# Patient Record
Sex: Female | Born: 1990
Health system: Southern US, Community
[De-identification: ages and names within clinical notes are randomized; demographics above are authoritative.]

## PROBLEM LIST (undated history)

## (undated) ENCOUNTER — Inpatient Hospital Stay: Payer: Self-pay

## (undated) DIAGNOSIS — O26879 Cervical shortening, unspecified trimester: Secondary | ICD-10-CM

## (undated) DIAGNOSIS — D649 Anemia, unspecified: Secondary | ICD-10-CM

## (undated) DIAGNOSIS — Z789 Other specified health status: Secondary | ICD-10-CM

## (undated) HISTORY — PX: NO PAST SURGERIES: SHX2092

## (undated) HISTORY — DX: Cervical shortening, unspecified trimester: O26.879

---
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
## 2009-01-03 ENCOUNTER — Ambulatory Visit: Payer: Self-pay | Admitting: Family Medicine

## 2009-01-03 DIAGNOSIS — R5381 Other malaise: Secondary | ICD-10-CM

## 2009-01-03 DIAGNOSIS — R5383 Other fatigue: Secondary | ICD-10-CM

## 2009-01-03 LAB — CONVERTED CEMR LAB
ALT: 12 units/L (ref 0–35)
CO2: 23 meq/L (ref 19–32)
Calcium: 9.5 mg/dL (ref 8.4–10.5)
Chloride: 107 meq/L (ref 96–112)
Cholesterol: 130 mg/dL (ref 0–200)
Creatinine, Ser: 0.72 mg/dL (ref 0.40–1.20)
Glucose, Bld: 93 mg/dL (ref 70–99)
HCT: 39 % (ref 36.0–46.0)
MCV: 87.6 fL (ref 78.0–100.0)
Platelets: 239 10*3/uL (ref 150–400)
RBC: 4.45 M/uL (ref 3.87–5.11)
Total CHOL/HDL Ratio: 2.8
Total Protein: 7.7 g/dL (ref 6.0–8.3)
Triglycerides: 45 mg/dL (ref <150)
WBC: 3.5 10*3/uL — ABNORMAL LOW (ref 4.0–10.5)

## 2009-01-08 ENCOUNTER — Encounter: Payer: Self-pay | Admitting: Family Medicine

## 2009-02-11 ENCOUNTER — Emergency Department (HOSPITAL_COMMUNITY): Admission: EM | Admit: 2009-02-11 | Discharge: 2009-02-11 | Payer: Self-pay | Admitting: Family Medicine

## 2009-03-13 ENCOUNTER — Ambulatory Visit: Payer: Self-pay | Admitting: Family Medicine

## 2009-03-13 ENCOUNTER — Telehealth: Payer: Self-pay | Admitting: Family Medicine

## 2009-03-13 DIAGNOSIS — R079 Chest pain, unspecified: Secondary | ICD-10-CM | POA: Insufficient documentation

## 2010-02-19 ENCOUNTER — Ambulatory Visit: Payer: Self-pay | Admitting: Family Medicine

## 2010-03-16 ENCOUNTER — Emergency Department (HOSPITAL_COMMUNITY): Admission: EM | Admit: 2010-03-16 | Discharge: 2010-03-16 | Payer: Self-pay | Admitting: Family Medicine

## 2010-03-20 ENCOUNTER — Telehealth: Payer: Self-pay | Admitting: Family Medicine

## 2010-03-20 ENCOUNTER — Ambulatory Visit (HOSPITAL_COMMUNITY)
Admission: RE | Admit: 2010-03-20 | Discharge: 2010-03-20 | Payer: Self-pay | Source: Home / Self Care | Admitting: Sports Medicine

## 2010-03-20 ENCOUNTER — Ambulatory Visit: Payer: Self-pay | Admitting: Family Medicine

## 2010-03-20 ENCOUNTER — Encounter: Payer: Self-pay | Admitting: Family Medicine

## 2010-03-20 LAB — CONVERTED CEMR LAB
BUN: 6 mg/dL (ref 6–23)
CO2: 22 meq/L (ref 19–32)
Glucose, Bld: 90 mg/dL (ref 70–99)
Potassium: 3.7 meq/L (ref 3.5–5.3)
Sodium: 138 meq/L (ref 135–145)

## 2010-03-27 ENCOUNTER — Ambulatory Visit: Payer: Self-pay | Admitting: Family Medicine

## 2010-03-27 ENCOUNTER — Encounter: Payer: Self-pay | Admitting: Family Medicine

## 2010-03-28 ENCOUNTER — Encounter: Payer: Self-pay | Admitting: Family Medicine

## 2010-03-29 LAB — CONVERTED CEMR LAB
HCT: 34 % — ABNORMAL LOW (ref 36.0–46.0)
MCV: 86.1 fL (ref 78.0–100.0)
Platelets: 453 10*3/uL — ABNORMAL HIGH (ref 150–400)
RDW: 13 % (ref 11.5–15.5)

## 2010-04-03 ENCOUNTER — Encounter: Payer: Self-pay | Admitting: Family Medicine

## 2010-04-03 ENCOUNTER — Ambulatory Visit: Payer: Self-pay | Admitting: Family Medicine

## 2010-04-07 ENCOUNTER — Encounter: Payer: Self-pay | Admitting: Family Medicine

## 2010-04-23 ENCOUNTER — Encounter: Payer: Self-pay | Admitting: Family Medicine

## 2010-05-09 ENCOUNTER — Ambulatory Visit: Payer: Self-pay | Admitting: Internal Medicine

## 2010-05-21 NOTE — Assessment & Plan Note (Signed)
Summary: RIB CAGE PAIN/RH   Vital Signs:  Patient profile:   20 year old female Weight:      109 pounds BMI:     18.78 Temp:     98.5 degrees F oral Pulse rate:   99 / minute Pulse rhythm:   regular BP sitting:   122 / 78  (left arm) Cuff size:   regular  Vitals Entered By: Mercedes Patel CMA (March 20, 2010 8:50 AM) CC: Rib Pain Is Patient Diabetic? No Pain Assessment Patient in pain? yes     Location: ribs Intensity: 9 Type: sharp Onset of pain  With activity Comments pt went to [facility] on saturday and was told that she had costocondritis   Primary Care Provider:  Denny Levy MD  CC:  Rib Pain.  History of Present Illness: [name] presents to clinic today for acute rib pain. The pain is located bilaterally in her lowest true ribs, and is tender to the touch.  Her pain has been off and on for around 1 year. However it actuely worsend this week and again yesterday. She presented to urgent care where she was diagnosed with costrocondritis vs sliding rib. She was adivsed to take full dose ibuprofen and follow up at the family practice center.  She did not take any ibuprofen last night and her pain continues today. She denies any fever, chills dyspnea on exertion, chest pain on exertion, or pain worse with eating. She does note that her pain is worse with bending or stretching.  She does note a pop in her ribs that occurs with pain when she rotates her torso or arms.    That pain previously was controlled with ibuprofen.  Habits & Providers  Alcohol-Tobacco-Diet     Tobacco Status: never  Current Problems (verified): 1)  Preventive Health Care  (ICD-V70.0) 2)  Rib Pain  (ICD-786.50) 3)  Fatigue  (ICD-780.79)  Current Medications (verified): 1)  Meloxicam 7.5 Mg Tabs (Meloxicam) .Marland Kitchen.. 1-2 By Mouth Daily As Needed Rib Pain. Take With Food 2)  Ranitidine Hcl 150 Mg Caps (Ranitidine Hcl) .Marland Kitchen.. 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 no tobacco, alcohol or illicits no regular exercise lives w Mom Mercedes Patel), Dad (Mercedes Patel) and younger sister Mercedes Patel)  Past Medical History: Rib pain think sliding rib x1 year 02/2010  Past Surgical History: NA  Family History: HTN in mother no significant FH ASCVD or cancer  Social History: Reviewed history from 01/03/2009 and no changes required. [employer] student in marketing, planning switch to psychology. no tobacco, alcohol or illicits no regular exercise lives w Mom Mercedes Patel), Dad (Mercedes Patel) and younger sister Mercedes Patel)  Review of Systems  The patient denies anorexia, fever, weight loss, chest pain, syncope, dyspnea on exertion, headaches, abdominal pain, hematochezia, severe indigestion/heartburn, muscle weakness, difficulty walking, and enlarged lymph nodes.    Physical Exam  General:  VS noted.  Thin woman with pain on motion Head:  normocephalic, atraumatic, and no abnormalities observed.   Eyes:  vision grossly intact.   Ears:  no external deformities.   Nose:  no external erythema.   Mouth:  good dentition, MMM Neck:  No JVD Lungs:  Normal respiratory effort, chest expands symmetrically. Lungs are clear to auscultation, no crackles or wheezes. Heart:  normal rate, regular rhythm, and no murmur.   Abdomen:  soft, non-tender, normal bowel sounds, no distention, and no masses.   Msk:  Ribs: TTP in bilateral lowest true ribs laterally.  A faint pop is noted with rotation of torso and ROM of arm in the tender area.  Extremities:  Non edemetus warm and well perfused.  Cervical Nodes:  No lymphadenopathy noted Axillary Nodes:  No palpable lymphadenopathy Psych:  memory intact for recent and remote, normally interactive, good eye contact, and not  anxious appearing.     Impression & Recommendations:  Problem # 1:  RIB PAIN (ICD-786.50) Assessment New  Exerbation of an existing issue. However the current pain is new. My chief diagnosis is sliding rib vs costrocondirits. I feel that bone pain due to malignancy is very unlikely due to no fever/chills or weight loss and age of 20 years old.   However I have discussed the case with her PCP (Dr. Jennette Patel) and Dr. Swaziland as well as [name]'s mother.  At this time they elect for more agressive diagnosis.  Will obtain a CT scan of her chest and abdomen with IV contrast to evaluate for question of maliganancy as well as evaluate cartilage for signs of chronic inflimation.  Will also obtain a BMP now prior to the CT scan to eval creatine prior to the contrast.   Treatment: Will treat with NSAIDs:  Ketoralic 10mg  IM now followed by Meloxican 7.5-15 mg daily as needed. As [name] sometimes does not eat well will provide ranitidine two times a day while taking mobic for ulcer prophylaxis.   Reviwed red flags with the patient who expressed understanding. Will follow up with Dr. Jennette Patel in 1-2 weeks. May benifit from PT in the future.   Orders: [facility]- Est  Level 4 ([address])  Complete Medication List: 1)  Meloxicam 7.5 Mg Tabs (Meloxicam) .Marland Kitchen.. 1-2 by mouth daily as needed rib pain. take with food 2)  Ranitidine Hcl 150 Mg Caps (Ranitidine hcl) .Marland Kitchen.. 1 by mouth two times a day while taking mobic  Other Orders: Basic Met-[facility] ([address]) CT with Contrast (CT w/ contrast)  Patient Instructions: 1)  Thank you for seeing me today. 2)  Please make an appiointment to see Dr. Jennette Patel in 1-2 weeks for you rib pain.  3)  Let us know if you get worse or not better with the mobic. 4)  Take the mobic 1-2 pills daily as needed for rib pain. 5)  Take the ranitidine twice daily while taking the mobic. 6)  Try to eat with your mobic.  7)  If you have chest pain, difficulty breathing, fevers over 102 that does not  get better with tylenol please call us or see a doctor.  8)  Good luck and we will try to get you back to the activities that you enjoy. Prescriptions: RANITIDINE HCL 150 MG CAPS (RANITIDINE HCL) 1 by mouth two times a day while taking mobic  #60 x 1   Entered and Authorized by:   Clementeen Graham MD   Signed by:   Clementeen Graham MD on 03/20/2010   Method used:   Electronically to        [facility]* (retail)       [address]       [address] Shipping/mailing       [place], [place]  [address]       Ph: [phone]       Fax: [phone]  RxID:   [id number] MELOXICAM 7.5 MG TABS (MELOXICAM) 1-2 by mouth daily as needed rib pain. Take with food  #30 x 0   Entered and Authorized by:   Clementeen Graham MD   Signed by:   Clementeen Graham MD on 03/20/2010   Method used:   Electronically to        [facility]* (retail)       [address]       [address] Shipping/mailing       [place], [place]  [address]       Ph: [phone]       Fax: [phone]   RxID:   [id number]    Orders Added: 1)  Basic Met-[facility] [phone] 2)  CT with Contrast [CT w/ contrast] 3)  [facility]- Est  Level 4 [[address]]  Appended Document: Sedrate  91 mm/hr    Lab Visit  Laboratory Results   Blood Tests   Date/Time Received: March 20, 2010 9:42 AM  Date/Time Reported: March 20, 2010 12:06 PM   SED rate: 91 mm/hr  Comments: ...............test performed by......Marland KitchenBonnie A. Mercedes Patel, MLS (ASCP)cm    Orders Today: Ketorolac-Toradol 15mg  [R4854]    Medication Administration  Injection # 1:    Medication: Ketorolac-Toradol 15mg     Diagnosis: RIB PAIN (ICD-786.50)    Route: IM    Site: R deltoid    Exp Date: 09/20/2011    Lot #: [id number]    Mfr: hospira    Patient tolerated injection without complications    Given by: Mercedes Patel CMA (March 20, 2010 3:18 PM)  Orders Added: 1)  Ketorolac-Toradol 15mg  [K9381]

## 2010-05-21 NOTE — Miscellaneous (Signed)
  Clinical Lists Changes  Orders: Added new Test order of Sed Rate (ESR)-[facility] [phone]) - Signed

## 2010-05-21 NOTE — Assessment & Plan Note (Signed)
Summary: cpe,df   Vital Signs:  Patient profile:   20 year old female Weight:      106 pounds Temp:     98.3 degrees F oral Pulse rate:   93 / minute Pulse rhythm:   regular BP sitting:   102 / 72  (left arm) Cuff size:   regular  Vitals Entered By: Loralee Pacas CMA (March 27, 2010 8:41 AM) CC: cpe   Primary Care Kalima Saylor:  Denny Levy MD  CC:  cpe.  History of Present Illness: CPE and f/u rib pain with elevated SED rate 1) CPE: normal menstrual cycles, no problems. Active in dance. 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 [place] for Christmas.  2) RIB pain; has almost completely resolved. ecounts thee total episodes with the last one nbeing the most severe. She thinks it might be related to her dance execises but recalls no specifi injury. PERTINENT PMH/PSH: no prior surgries or hospitalizations. No episodes of joint pain redness or swelling. No known FH of rheum disorders.  Current Medications (verified): 1)  None  Allergies (verified): No Known Drug Allergies  Past History:  Past Medical History: recurrent costochondritis ( 3 episodes ) 2011  Past Surgical History: None  Review of Systems  The patient denies anorexia, fever, weight loss, weight gain, chest pain, syncope, dyspnea on exertion, peripheral edema, prolonged cough, headaches, hemoptysis, abdominal pain, severe indigestion/heartburn, muscle weakness, and enlarged lymph nodes.         nno enlarged or painful joints.  Physical Exam  General:  alert, well-developed, well-nourished, and well-hydrated.   Eyes:  PERRLA EOMI conjunctiva non icteric Ears:  Tms normal right ear helix piercing Nose:  no deformity Mouth:  good dentition Neck:  supple, full ROM, no masses, no thyromegaly, no thyroid nodules or tenderness, and no cervical lymphadenopathy.   Lungs:  normal respiratory effort, normal breath sounds, and no wheezes.   Heart:  normal rate, regular rhythm, and no murmur.   Abdomen:  soft,  non-tender, normal bowel sounds, and no masses.   Genitalia:  deferred Msk:  normal ROM, no joint tenderness, no joint swelling, and no joint warmth.   Extremities:  no edema Neurologic:  alert & oriented X3 and strength normal in all extremities.   Skin:  color normal, no rashes, and no suspicious lesions.   Cervical Nodes:  negative Axillary Nodes:  negative Inguinal Nodes:  negative Psych:  Oriented X3, memory intact for recent and remote, normally interactive, good eye contact, not anxious appearing, and not depressed appearing.      Impression & Recommendations:  Problem # 1:  PREVENTIVE HEALTH CARE (ICD-V70.0)  Orders: [facility] - Est  18-39 yrs ([address]) no pap indicated discussed MVI / Calcium exercise and contraception planning  Problem # 2:  RIB PAIN (ICD-786.50)  Orders: [facility] - Est  18-39 yrs ([address]) CBC-[facility] (09811) Sed Rate (ESR)-[facility] (91478) Rheum Fact-[facility] ([address]) ANA-[facility] ([address]-57[address]) this has significantly improved but with three occurrences and n abnormal SED rate we will just do a few scree labs for rheum disorders she will let me know of futire rib pain issues or MSK problems that might be similar   Orders Added: 1)  [facility] - Est  18-39 yrs [[address]395] 2)  CBC-[facility] [85027] 3)  Sed Rate (ESR)-[facility] [85651] 4)  Rheum Fact-[facility] [86430] 5)  ANA-[facility] [96295-28413]    Laboratory Results   Blood Tests   Date/Time Received: March 27, 2010 9:01 AM  Date/Time Reported: March 27, 2010  10:57 AM   SED rate: 94  mm/hr  Comments: ...............test performed by......Marland KitchenBonnie A. Swaziland, MLS (ASCP)cm

## 2010-05-21 NOTE — Assessment & Plan Note (Signed)
Summary: flu shot,df  Nurse Visit   Vital Signs:  Patient profile:   20 year old female Temp:     98.3 degrees F  Vitals Entered By: Theresia Lo RN (February 19, 2010 9:45 AM)  Allergies: No Known Drug Allergies  Immunizations Administered:  Influenza Vaccine # 1:    Vaccine Type: Fluvax 3+    Site: left deltoid    Mfr: GlaxoSmithKline    Dose: 0.5 ml    Route: IM    Given by: Theresia Lo RN    Exp. Date: 10/16/2010    Lot #: [id number]    VIS given: 11/13/09 version given February 19, 2010.  Flu Vaccine Consent Questions:    Do you have a history of severe allergic reactions to this vaccine? no    Any prior history of allergic reactions to egg and/or gelatin? no    Do you have a sensitivity to the preservative Thimersol? no    Do you have a past history of Guillan-Barre Syndrome? no    Do you currently have an acute febrile illness? no    Have you ever had a severe reaction to latex? no    Vaccine information given and explained to patient? yes    Are you currently pregnant? no  Orders Added: 1)  Flu Vaccine 37yrs + [90658] 2)  Admin 1st Vaccine [09811]

## 2010-05-21 NOTE — Assessment & Plan Note (Signed)
Summary: pain under breast /Garrison/neal's pt   Vital Signs:  Patient profile:   20 year old female Height:      64 inches Weight:      108.8 pounds Temp:     98.6 degrees F Pulse rate:   80 / minute BP sitting:   116 / 83  (left arm)  Vitals Entered By: Theresia Lo RN (March 13, 2009 1:51 PM) CC: pain under right breast in rib cage area Is Patient Diabetic? No   Primary Care Provider:  Denny Levy MD  CC:  pain under right breast in rib cage area.  History of Present Illness: 20 y/o thin AAF:  1. Rib pain: right anterior, midclavicular line, ribs 9-10. x a few weeks. worse with rotation, esp when she is working. better with rest. this has happened before: about 6 months ago, same pain, physician at [facility] "popped" it back into place and it was better for several months. she is a Horticulturist, commercial. no known trauma. no obvious deformity. it clicks when she rotates. she denies CP, SOB, N/V/C/D, abdominal pain, back pain.   Habits & Providers  Alcohol-Tobacco-Diet     Tobacco Status: never  Current Medications (verified): 1)  None  Allergies (verified): No Known Drug Allergies  Review of Systems General:  Denies chills, fever, and weight loss. CV:  Denies chest pain or discomfort, palpitations, and shortness of breath with exertion. Resp:  Denies cough, pleuritic, and shortness of breath. MS:  Denies mid back pain. Derm:  Denies rash. Neuro:  Denies numbness and tingling.  Physical Exam  General:  alert, well-developed, and well-nourished.  vitals reviewed. Chest Wall:  mild ttp along the midclavicular line of ribs 9-10 on the right, palpable click when patient rotates to the left and right. no obvious deformity. no skin changes. no splinting.  Lungs:  normal respiratory effort, normal breath sounds, and no wheezes.   Heart:  normal rate, regular rhythm, and no murmur.     Impression & Recommendations:  Problem # 1:  RIB PAIN, RIGHT SIDED (ICD-786.50) Assessment New  No red  flags. Pain and palpable click with with rotation seem to be at area where bone joins cartilage. Recent CXR negative. Precepted patient - patient should be able to continue with dancing and regular activities, concentrate on symptomatic treatment. Advised heat/ice to area, Ibuprofen for pain, with few Vicodin provided for severe pain. Patient may also try rib splint, though this should not be worn too often as it decreases deep breaths. Gave patient and mother red flags. Rec community osteopathic physician if they want osteopathic manipulation to the area.  Orders: [facility]- Est Level  3 ([address])   Vital Signs:  Patient profile:   20 year old female Height:      64 inches Weight:      108.8 pounds Temp:     98.6 degrees F Pulse rate:   80 / minute BP sitting:   116 / 83  (left arm)  Vitals Entered By: Theresia Lo RN (March 13, 2009 1:51 PM)

## 2010-05-21 NOTE — Progress Notes (Signed)
  Phone Note Outgoing Call   Summary of Call: left mssg for Mom as we had arranged---[phone]. CT normal. SED rate elevated. Meds called in. Denny Levy MD  March 20, 2010 3:31 PM   Follow-up for Phone Call        spoke w Mom Nichole has f/u w me next week--will repeat SED rate and possibly add some othe labs depending onher status    New/Updated Medications: TRAMADOL HCL 50 MG TABS (TRAMADOL HCL) 1-2 by mouth q 8 hrs as needed rib pain FLEXERIL 5 MG TABS (CYCLOBENZAPRINE HCL) 1/2-1 by mouth at bedtime as needed muscle spasm Prescriptions: TRAMADOL HCL 50 MG TABS (TRAMADOL HCL) 1-2 by mouth q 8 hrs as needed rib pain  #45 x 1   Entered and Authorized by:   Denny Levy MD   Signed by:   Denny Levy MD on 03/21/2010   Method used:   Electronically to        [facility]* (retail)       [address]       [address] Shipping/mailing       [place], [place]  [address]       Ph: [phone]       Fax: [phone]   RxID:   [id number] FLEXERIL 5 MG TABS (CYCLOBENZAPRINE HCL) 1/2-1 by mouth at bedtime as needed muscle spasm  #30 x 1   Entered and Authorized by:   Denny Levy MD   Signed by:   Denny Levy MD on 03/21/2010   Method used:   Electronically to        [facility]* (retail)       [address]       [address] Shipping/mailing       [place], [place]  [address]       Ph: [phone]       Fax: [phone]   RxID:   [id number] FLEXERIL 5 MG TABS (CYCLOBENZAPRINE HCL) 1/2-1 by mouth at bedtime as needed muscle spasm  #30 x 1   Entered and Authorized by:   Denny Levy MD   Signed by:   Denny Levy MD on 03/20/2010   Method used:   Electronically to        [id number] (retail)       [address]       [place], [place]  [address]       Ph: [phone] or [phone]       Fax: [phone]   RxID:   [id number] TRAMADOL HCL 50 MG TABS (TRAMADOL HCL) 1-2 by mouth q 8 hrs as needed rib pain  #45  x 1   Entered and Authorized by:   Denny Levy MD   Signed by:   Denny Levy MD on 03/20/2010   Method used:   Electronically to        [id number] (retail)       [address]       [place], [place]  [address]       Ph: [phone] or [phone]       Fax: [phone]   RxID:   [id number]

## 2010-05-21 NOTE — Progress Notes (Signed)
Summary: rib pain   Mom calls: Pt was seen at [facility] on Sunday, dx with Costochondritis. Taking Ibuprofen but starting to make her stomach hurt. Didn't sleep last night because of the pain. Wants to either go to the ED or come in to the clinic to be seen. Encouraged to come to the clinic this am at 8:30am for a work in appointment. Will send Admin a flag. Jamie Brookes MD  March 20, 2010 7:57 AM

## 2010-05-21 NOTE — Miscellaneous (Signed)
Summary: ROI  ROI   Imported By: Bradly Bienenstock 03/28/2010 15:30:47  _____________________________________________________________________  External Attachment:    Type:   Image     Comment:   External Document

## 2010-05-21 NOTE — Letter (Signed)
Summary: LAB Letter  [facility]  [address]   [place], [place] [address]   Phone: [phone]  Fax: [phone]    03/28/2010  Allyssa PARRISH [address] [place], [place]  [address]  Dear Ms. PARRISH,  Your lab work was all normal except your Sedimentation Rate was still elevated. This is a bit puzzling but not worrisome. Your hemoglobin, white blood cell count, rheumatoid factor and ANA were normal so I suspect this elevation in the  SED rate is left over from whatever caused your rib inflammation.  I would like to recheck this at some time in the  future--say about 4-6 weeks from now---just to make sure it has returned to baseline. I know you are at school so I could mail you a prescription and they could draw it at school, or perhaps if you are home sometime around that time we can get it drawn here.   I will go ahead and put the order in here for future lab work and you can come by in 4-6 weeks for a lab only visit. No fasting necessary. If you decide to have it drawn elsewhere,let me know ad I will send you a prescription order. Glad you are feeling better.       Sincerely,   Denny Levy MD  Appended Document: LAB Letter mailed

## 2010-05-23 NOTE — Letter (Signed)
Summary: LAB Letter  [facility]  [address]   [place], [place] [address]   Phone: [phone]  Fax: [phone]    04/07/2010  Mercedes Patel [address] [place], [place]  [address]  Dear Ms. Patel,   SED rate is trending back down and the other labs I drew were normal.        Sincerely,   Denny Levy MD

## 2010-05-23 NOTE — Consult Note (Signed)
Summary: [facility]  [facility]   Imported By: De Nurse 05/06/2010 14:40:31  _____________________________________________________________________  External Attachment:    Type:   Image     Comment:   External Document

## 2010-05-23 NOTE — Assessment & Plan Note (Signed)
Summary: f/u visit/bmc   Vital Signs:  Patient profile:   20 year old female Weight:      107.3 pounds BMI:     18.48 Temp:     98.2 degrees F oral Pulse rate:   97 / minute BP sitting:   116 / 75  (left arm) Cuff size:   regular  Vitals Entered By: Jimmy Footman, CMA (April 03, 2010 9:02 AM) CC: f/u  rib pain   Primary Care Provider:  Denny Levy MD  CC:  f/u  rib pain.  History of Present Illness: f/u rib pain. had totally resolved until yesterday when she had acute onset of rib pain, bilateral, while she was at a luncheon, Was sitting when it happened. started to hurt to do any moving, almost like a spasm. She went home and took some ibuprofen and it got better. No pain this am but she wanted to come on in. Is here with her mother.  After I saw her the pain meds helped--she used the for about three days and then was fine. Until yesterday,  Habits & Providers  Alcohol-Tobacco-Diet     Tobacco Status: never  Current Medications (verified): 1)  Diclofenac Sodium 75 Mg Tbec (Diclofenac Sodium) .... Two Times A Day As Needed 2)  Tramadol Hcl 50 Mg Tabs (Tramadol Hcl) .Marland Kitchen.. 1 By Mouth Three Times A Day Prn  Allergies: No Known Drug Allergies  Review of Systems  The patient denies anorexia, fever, weight loss, weight gain, dyspnea on exertion, and abdominal pain.    Physical Exam  General:  alert, well-developed, well-nourished, and well-hydrated.   Lungs:  normal breath sounds.   Heart:  normal rate and regular rhythm.   Msk:  no rib or thorax tenderness. normal movement of thorax without pain.   Impression & Recommendations:  Problem # 1:  RIB PAIN (ICD-786.50)  Orders: Sed Rate (ESR)-[facility] ([address]) LDH-[facility] ([address]) CK (Creatine Kinase)-[facility] ([address]-914[address]) [facility]- Est Level  3 (29562) thi sounds much milder--we had decided to repeat the SED rate which was elevated--it is a little early but will repeatto see if trneding down. mom is qute worried--if SED rate remains hiigh  (or if LDH et is elevated) we could refer to rheum. discussed her other labs including positive but clinically negtaive ANA. She will let me know of any additional episdoes. f/u prn  Complete Medication List: 1)  Diclofenac Sodium 75 Mg Tbec (Diclofenac sodium) .... Two times a day as needed 2)  Tramadol Hcl 50 Mg Tabs (Tramadol hcl) .Marland Kitchen.. 1 by mouth three times a day prn   Orders Added: 1)  Sed Rate (ESR)-[facility] [85651] 2)  LDH-[facility] [83615] 3)  CK (Creatine Kinase)-[facility] [[address]250] 4)  [facility]- Est Level  3 [99213]    Laboratory Results   Blood Tests   Date/Time Received: April 03, 2010 9:26 AM  Date/Time Reported: April 03, 2010 11:56 AM   SED rate: 82 mm/hr  Comments: ...............test performed by......Marland KitchenBonnie A. Swaziland, MLS (ASCP)cm

## 2010-05-29 ENCOUNTER — Other Ambulatory Visit: Payer: Self-pay | Admitting: Oncology

## 2010-05-29 ENCOUNTER — Encounter (HOSPITAL_BASED_OUTPATIENT_CLINIC_OR_DEPARTMENT_OTHER): Payer: Commercial Managed Care - PPO | Admitting: Oncology

## 2010-05-29 DIAGNOSIS — D892 Hypergammaglobulinemia, unspecified: Secondary | ICD-10-CM

## 2010-05-29 LAB — CBC WITH DIFFERENTIAL/PLATELET
Basophils Absolute: 0 10*3/uL (ref 0.0–0.1)
Eosinophils Absolute: 0.1 10*3/uL (ref 0.0–0.5)
HCT: 35.2 % (ref 34.8–46.6)
HGB: 11.5 g/dL — ABNORMAL LOW (ref 11.6–15.9)
LYMPH%: 37.5 % (ref 14.0–49.7)
MONO#: 0.2 10*3/uL (ref 0.1–0.9)
NEUT#: 2.3 10*3/uL (ref 1.5–6.5)
NEUT%: 54 % (ref 38.4–76.8)
Platelets: 306 10*3/uL (ref 145–400)
RBC: 4.25 10*6/uL (ref 3.70–5.45)
WBC: 4.3 10*3/uL (ref 3.9–10.3)

## 2010-05-29 LAB — COMPREHENSIVE METABOLIC PANEL
CO2: 25 mEq/L (ref 19–32)
Glucose, Bld: 90 mg/dL (ref 70–99)
Sodium: 138 mEq/L (ref 135–145)
Total Bilirubin: 0.5 mg/dL (ref 0.3–1.2)
Total Protein: 9.6 g/dL — ABNORMAL HIGH (ref 6.0–8.3)

## 2010-05-30 ENCOUNTER — Encounter: Payer: Self-pay | Admitting: *Deleted

## 2010-05-31 LAB — SPEP & IFE WITH QIG
Albumin ELP: 41.8 % — ABNORMAL LOW (ref 55.8–66.1)
Alpha-1-Globulin: 4 % (ref 2.9–4.9)
Beta 2: 5.5 % (ref 3.2–6.5)
Beta Globulin: 5.5 % (ref 4.7–7.2)
IgA: 317 mg/dL (ref 68–378)

## 2010-05-31 LAB — KAPPA/LAMBDA LIGHT CHAINS
Kappa free light chain: 2.43 mg/dL — ABNORMAL HIGH (ref 0.33–1.94)
Lambda Free Lght Chn: 3.22 mg/dL — ABNORMAL HIGH (ref 0.57–2.63)

## 2010-06-03 ENCOUNTER — Other Ambulatory Visit: Payer: Self-pay | Admitting: Oncology

## 2010-06-05 LAB — UIFE/LIGHT CHAINS/TP QN, 24-HR UR
Albumin, U: DETECTED
Alpha 1, Urine: DETECTED — AB
Alpha 2, Urine: DETECTED — AB
Free Kappa/Lambda Ratio: 13.23 ratio — ABNORMAL HIGH (ref 0.46–4.00)
Total Protein, Urine-Ur/day: 36 mg/d (ref 10–140)
Total Protein, Urine: 7.2 mg/dL

## 2010-06-06 ENCOUNTER — Encounter (HOSPITAL_BASED_OUTPATIENT_CLINIC_OR_DEPARTMENT_OTHER): Payer: Commercial Managed Care - PPO | Admitting: Oncology

## 2010-06-06 DIAGNOSIS — D892 Hypergammaglobulinemia, unspecified: Secondary | ICD-10-CM

## 2010-06-26 ENCOUNTER — Encounter: Payer: Self-pay | Admitting: Family Medicine

## 2010-06-26 DIAGNOSIS — D472 Monoclonal gammopathy: Secondary | ICD-10-CM | POA: Insufficient documentation

## 2010-07-02 LAB — POCT URINALYSIS DIPSTICK
Bilirubin Urine: NEGATIVE
Glucose, UA: NEGATIVE mg/dL
Ketones, ur: 15 mg/dL — AB

## 2010-07-02 LAB — POCT PREGNANCY, URINE: Preg Test, Ur: NEGATIVE

## 2010-07-10 ENCOUNTER — Other Ambulatory Visit (HOSPITAL_COMMUNITY): Payer: Self-pay | Admitting: Rheumatology

## 2010-07-10 DIAGNOSIS — R0781 Pleurodynia: Secondary | ICD-10-CM

## 2010-07-15 ENCOUNTER — Encounter: Payer: Self-pay | Admitting: Family Medicine

## 2010-07-15 DIAGNOSIS — D472 Monoclonal gammopathy: Secondary | ICD-10-CM

## 2010-07-15 DIAGNOSIS — R768 Other specified abnormal immunological findings in serum: Secondary | ICD-10-CM

## 2010-07-15 DIAGNOSIS — R7689 Other specified abnormal immunological findings in serum: Secondary | ICD-10-CM | POA: Insufficient documentation

## 2010-07-23 ENCOUNTER — Ambulatory Visit (HOSPITAL_COMMUNITY): Payer: Commercial Managed Care - PPO

## 2010-07-23 ENCOUNTER — Encounter (HOSPITAL_COMMUNITY): Payer: Commercial Managed Care - PPO

## 2011-01-14 ENCOUNTER — Inpatient Hospital Stay (INDEPENDENT_AMBULATORY_CARE_PROVIDER_SITE_OTHER)
Admission: RE | Admit: 2011-01-14 | Discharge: 2011-01-14 | Disposition: A | Discharge status: Home / Self Care | Payer: 59 | Source: Ambulatory Visit | Attending: Family Medicine | Admitting: Family Medicine

## 2011-01-14 ENCOUNTER — Emergency Department (HOSPITAL_COMMUNITY): Payer: 59

## 2011-01-14 ENCOUNTER — Emergency Department (HOSPITAL_COMMUNITY)
Admission: EM | Admit: 2011-01-14 | Discharge: 2011-01-14 | Disposition: A | Discharge status: Home / Self Care | Payer: 59 | Attending: Emergency Medicine | Admitting: Emergency Medicine

## 2011-01-14 DIAGNOSIS — R630 Anorexia: Secondary | ICD-10-CM | POA: Insufficient documentation

## 2011-01-14 DIAGNOSIS — R10816 Epigastric abdominal tenderness: Secondary | ICD-10-CM

## 2011-01-14 DIAGNOSIS — R1084 Generalized abdominal pain: Secondary | ICD-10-CM | POA: Insufficient documentation

## 2011-01-14 LAB — CBC
HCT: 36.5 % (ref 36.0–46.0)
MCHC: 33.7 g/dL (ref 30.0–36.0)
Platelets: 214 10*3/uL (ref 150–400)
RDW: 12.9 % (ref 11.5–15.5)
WBC: 7.2 10*3/uL (ref 4.0–10.5)

## 2011-01-14 LAB — POCT URINALYSIS DIP (DEVICE)
Bilirubin Urine: NEGATIVE
Glucose, UA: NEGATIVE mg/dL
Leukocytes, UA: NEGATIVE
Nitrite: NEGATIVE
Urobilinogen, UA: 0.2 mg/dL (ref 0.0–1.0)

## 2011-01-14 LAB — DIFFERENTIAL
Basophils Absolute: 0 10*3/uL (ref 0.0–0.1)
Basophils Relative: 0 % (ref 0–1)
Eosinophils Relative: 1 % (ref 0–5)
Lymphocytes Relative: 27 % (ref 12–46)

## 2011-01-14 LAB — URINALYSIS, ROUTINE W REFLEX MICROSCOPIC
Glucose, UA: NEGATIVE mg/dL
Ketones, ur: 15 mg/dL — AB
Leukocytes, UA: NEGATIVE
Nitrite: NEGATIVE
Protein, ur: NEGATIVE mg/dL
Urobilinogen, UA: 0.2 mg/dL (ref 0.0–1.0)

## 2011-01-14 LAB — POCT I-STAT, CHEM 8
Glucose, Bld: 84 mg/dL (ref 70–99)
HCT: 40 % (ref 36.0–46.0)
Hemoglobin: 13.6 g/dL (ref 12.0–15.0)
Potassium: 3.8 mEq/L (ref 3.5–5.1)
Sodium: 138 mEq/L (ref 135–145)
TCO2: 24 mmol/L (ref 0–100)

## 2011-01-14 LAB — POCT PREGNANCY, URINE: Preg Test, Ur: NEGATIVE

## 2011-01-14 MED ORDER — IOHEXOL 300 MG/ML  SOLN
80.0000 mL | Freq: Once | INTRAMUSCULAR | Status: AC | PRN
  Administered 2011-01-14: 80 mL via INTRAVENOUS

## 2011-02-05 ENCOUNTER — Ambulatory Visit (INDEPENDENT_AMBULATORY_CARE_PROVIDER_SITE_OTHER): Payer: 59 | Admitting: Family Medicine

## 2011-02-05 ENCOUNTER — Encounter: Payer: Self-pay | Admitting: Family Medicine

## 2011-02-05 VITALS — BP 116/72 | HR 72 | Temp 97.6°F | Ht 64.0 in | Wt 104.0 lb

## 2011-02-05 DIAGNOSIS — Z23 Encounter for immunization: Secondary | ICD-10-CM

## 2011-02-05 DIAGNOSIS — D472 Monoclonal gammopathy: Secondary | ICD-10-CM

## 2011-02-05 DIAGNOSIS — M940 Chondrocostal junction syndrome [Tietze]: Secondary | ICD-10-CM

## 2011-02-05 MED ORDER — TRAMADOL HCL 50 MG PO TABS: 50.0000 mg | ORAL_TABLET | Freq: Three times a day (TID) | ORAL | Status: DC | PRN

## 2011-02-05 MED ORDER — DICLOFENAC SODIUM 75 MG PO TBEC: 75.0000 mg | DELAYED_RELEASE_TABLET | Freq: Two times a day (BID) | ORAL | Status: DC

## 2011-02-05 NOTE — Patient Instructions (Signed)
Let's try starting the diclofenac daily with meals. Do not take any additional advil, ibuprofen, motrin or aleve with it. If you have additional pain, use the tramadol. I will see you back as we discussed. Call if things are getting worse in the interim. Great to see you!

## 2011-02-05 NOTE — Progress Notes (Signed)
  Subjective:    Patient ID: Mercedes Patel, female    DOB: 10-Jan-1991, 20 y.o.   MRN: [id number]  HPI  Rib pain on the right over the last few days. It seems worse when she's tired. This is the same kind of rib pain she had last year and the year before last. She wonders if there is a seasonal component to it. She's does admit to being under a lot of stress secondary to her senior year of college. She also wonders if stress contributes. She's not had any shortness of breath. The pain is not there with every breath. It does get a little worse with deep inspiration ; she's had no cough, no shortness of breath, no wheezes.  She's otherwise been fairly well. Was seen at the emergency room for some abdominal pain couple of months ago. She thinks that was related to stress probably as well because they did a fairly complete workup on her and was negative. The pain resolved within the next day after she returned home and she's had no issues with it since.   PERTINENT  PMH / PSH: MGUS Review of Systems    no fever, sweats, chills. Denies any usual weight loss. Has not felt more fatigued than usual. Her energy level is good. See history of present illness for additional details of review of systems. Objective:   Physical Exam Vital signs reviewed. GENERAL: Well-developed, well-nourished, no acute distress. CARDIOVASCULAR: Regular rate and rhythm no murmur gallop or rub LUNGS: Clear to auscultation bilaterally, no rales or wheeze. Normal inspiratory effort. No pain with inspiration today. ABDOMEN: Soft positive bowel sounds. Mildly tender to palpation over the right costal margin up to the sternocostal margin. Palpation of this area does reproduce similar type pain. There is no CVA tenderness in the back. There is no focal rib abnormality palpated. NEURO: No gross focal neurological deficits. MSK: Movement of extremity x 4.   Review of vs--weight stable +/- 5 lbs      Assessment & Plan:   #1.  Recurrent costochondritis. She has had a fairly big workup for this and was ultimately diagnosed with potential MGUS. Unclear if the costochondritis recurrent episodes are related to the MGUS or a separate issue that just initiated her rheumatological workup. Today her clinical exam is consistent with costochondritis. We discussed several options and decided to start her back on the medicines we used last year which include diclofenac and tramadol for breakthrough pain. She has a followup appointment scheduled me for CPE in December and I will see her then. Sooner if problems. Flu shot today.

## 2011-03-12 ENCOUNTER — Other Ambulatory Visit: Payer: Self-pay | Admitting: Oncology

## 2011-03-12 ENCOUNTER — Telehealth: Payer: Self-pay | Admitting: *Deleted

## 2011-03-12 ENCOUNTER — Other Ambulatory Visit (HOSPITAL_BASED_OUTPATIENT_CLINIC_OR_DEPARTMENT_OTHER): Payer: 59 | Admitting: Lab

## 2011-03-12 DIAGNOSIS — D892 Hypergammaglobulinemia, unspecified: Secondary | ICD-10-CM

## 2011-03-12 LAB — CBC WITH DIFFERENTIAL/PLATELET
LYMPH%: 41.3 % (ref 14.0–49.7)
MCH: 28.7 pg (ref 25.1–34.0)
MCHC: 33.5 g/dL (ref 31.5–36.0)
MONO#: 0.2 10*3/uL (ref 0.1–0.9)
MONO%: 5.5 % (ref 0.0–14.0)
NEUT%: 49.3 % (ref 38.4–76.8)
RBC: 4.34 10*6/uL (ref 3.70–5.45)
RDW: 14.3 % (ref 11.2–14.5)
WBC: 3.3 10*3/uL — ABNORMAL LOW (ref 3.9–10.3)
lymph#: 1.4 10*3/uL (ref 0.9–3.3)

## 2011-03-17 LAB — COMPREHENSIVE METABOLIC PANEL
ALT: 15 U/L (ref 0–35)
Albumin: 4.3 g/dL (ref 3.5–5.2)
Alkaline Phosphatase: 68 U/L (ref 39–117)
CO2: 25 mEq/L (ref 19–32)
Glucose, Bld: 69 mg/dL — ABNORMAL LOW (ref 70–99)
Potassium: 4.2 mEq/L (ref 3.5–5.3)
Sodium: 140 mEq/L (ref 135–145)
Total Bilirubin: 0.8 mg/dL (ref 0.3–1.2)
Total Protein: 7.1 g/dL (ref 6.0–8.3)

## 2011-03-17 LAB — PROTEIN ELECTROPHORESIS, SERUM
Albumin ELP: 56.1 % (ref 55.8–66.1)
Beta 2: 5.5 % (ref 3.2–6.5)
Beta Globulin: 6.8 % (ref 4.7–7.2)
Total Protein, Serum Electrophoresis: 7.1 g/dL (ref 6.0–8.3)

## 2011-03-21 ENCOUNTER — Encounter: Payer: Self-pay | Admitting: *Deleted

## 2011-03-25 ENCOUNTER — Ambulatory Visit: Payer: 59 | Admitting: Oncology

## 2011-04-02 ENCOUNTER — Ambulatory Visit (HOSPITAL_BASED_OUTPATIENT_CLINIC_OR_DEPARTMENT_OTHER): Payer: 59 | Admitting: Oncology

## 2011-04-02 VITALS — BP 116/66 | HR 85 | Temp 98.3°F | Ht 63.5 in | Wt 108.8 lb

## 2011-04-02 DIAGNOSIS — D890 Polyclonal hypergammaglobulinemia: Secondary | ICD-10-CM

## 2011-04-02 DIAGNOSIS — D7589 Other specified diseases of blood and blood-forming organs: Secondary | ICD-10-CM

## 2011-04-02 NOTE — Progress Notes (Signed)
[facility] OFFICE PROGRESS NOTE  Denny Levy, MD, MD   DIAGNOSIS:  Reactive hypergammaglobulinemia.  No evidence of MGUS or myeloma now.   CURRENT THERAPY:  watchful observation.  INTERVAL HISTORY: Mercedes Patel 20 y.o. female returns to clinic today with her mother.  She still has intermittent left anterior ribs pain.  The pain is crampy and mild is resolved with Tramadol prn.  She denies bone pain elsewhere.    Patient denies fatigue, headache, visual changes, confusion, drenching night sweats, palpable lymph node swelling, mucositis, odynophagia, dysphagia, nausea vomiting, jaundice, chest pain, palpitation, shortness of breath, dyspnea on exertion, productive cough, gum bleeding, epistaxis, hematemesis, hemoptysis, abdominal pain, abdominal swelling, early satiety, melena, hematochezia, hematuria, skin rash, spontaneous bleeding, joint swelling, joint pain, heat or cold intolerance, bowel bladder incontinence, back pain, focal motor weakness, paresthesia, depression, suicidal or homocidal ideation, feeling hopelessness.   MEDICAL HISTORY:No past medical history on file.  SURGICAL HISTORY: No past surgical history on file.  MEDICATIONS: Current Outpatient Prescriptions  Medication Sig Dispense Refill  . diclofenac (VOLTAREN) 75 MG EC tablet Take 1 tablet (75 mg total) by mouth 2 (two) times daily.  60 tablet  3  . traMADol (ULTRAM) 50 MG tablet Take 1 tablet (50 mg total) by mouth 3 (three) times daily as needed.  30 tablet  1    ALLERGIES:   has no known allergies.  REVIEW OF SYSTEMS:  The rest of the 14-point review of system was negative.   Filed Vitals:   04/02/11 1338  BP: 116/66  Pulse: 85  Temp: 98.3 F (36.8 C)   Wt Readings from Last 3 Encounters:  04/02/11 108 lb 12.8 oz (49.351 kg)  05/29/10 107 lb (48.535 kg)  02/05/11 104 lb (47.174 kg)   ECOG Performance status: 0  PHYSICAL EXAMINATION:  Not performed due to lack of change.    LABORATORY/RADIOLOGY DATA: WBC 3.3; Hgb 12.4; Plt 261. Cr 0.68; Ca 9.4.  SPEP:  No M-spike. Normal serum kappa and lambda.   ASSESSMENT AND PLAN:   I discussed with patient and her mother that even though she had elevated Ig's level.  Her SPEP and serum light chain have been negative.  She does not have evidence of myeloma.  She does not have anemia, hypercalcemia, renal insufficiency.  She also has most likely costochondritis which is very well controlled with when necessary pain medication by her rheumatologist. As she has no evidence of MGUS or multiple myeloma I recommended discharge in the clinic. I referred her back to her PCP to check her CBC and CMET at least yearly or sooner if she has symptoms. In the future if she has severe anemia, renal insufficiency or hypercalcemia, then, she can be referred back to the Cancer Center for further evaluation.  Mercedes Patel and her mother expressed informed understanding and agreed to follow with her PCP only.

## 2012-05-17 ENCOUNTER — Encounter: Payer: Self-pay | Admitting: Family Medicine

## 2012-05-17 ENCOUNTER — Ambulatory Visit (INDEPENDENT_AMBULATORY_CARE_PROVIDER_SITE_OTHER): Payer: 59 | Admitting: Family Medicine

## 2012-05-17 ENCOUNTER — Ambulatory Visit: Payer: 59 | Admitting: Family Medicine

## 2012-05-17 VITALS — BP 113/71 | HR 73 | Temp 97.8°F | Ht 64.0 in | Wt 108.0 lb

## 2012-05-17 DIAGNOSIS — J019 Acute sinusitis, unspecified: Secondary | ICD-10-CM

## 2012-05-17 MED ORDER — FLUTICASONE PROPIONATE 50 MCG/ACT NA SUSP: 2.0000 | Freq: Every day | NASAL | Status: DC

## 2012-05-17 MED ORDER — GUAIFENESIN ER 600 MG PO TB12: 1200.0000 mg | ORAL_TABLET | Freq: Two times a day (BID) | ORAL | Status: DC

## 2012-05-17 NOTE — Progress Notes (Signed)
  Subjective:    Patient ID: Mercedes Patel, female    DOB: [birth year], 22 y.o.   MRN: [id number]  HPI 22 y.o. female with sinus congestion for 1 week. Sore throat today. Using netty pot, clear mucous. Cough with yellow sputum started a few days ago. Using OTC meds:  Sudafed, dayquil, iburpofen. No fever. No ear pain. 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   Review of Systems  Constitutional: Negative for fever, chills and diaphoresis.  HENT: Positive for congestion, sore throat, rhinorrhea, sneezing, postnasal drip and sinus pressure. Negative for ear pain, nosebleeds, facial swelling, neck pain and ear discharge.   Respiratory: Positive for cough. Negative for shortness of breath and wheezing.   Gastrointestinal: Negative for nausea, vomiting and diarrhea.  Neurological: Negative for dizziness.       Objective:   Physical Exam  Constitutional: She is oriented to person, place, and time. She appears well-developed and well-nourished. No distress.  HENT:  Head: Normocephalic and atraumatic.  Right Ear: External ear normal.  Left Ear: External ear normal.       OP:  Mild erythema, no exudate.  Nasal turbinates swollen.  No sinus tenderness frontal or maxillary.  Eyes: Conjunctivae normal and EOM are normal.  Neck: Normal range of motion. Neck supple.  Cardiovascular: Normal rate, regular rhythm and normal heart sounds.   Pulmonary/Chest: Effort normal and breath sounds normal. No respiratory distress. She has no wheezes.  Musculoskeletal: Normal range of motion. She exhibits no edema and no tenderness.  Lymphadenopathy:    She has cervical adenopathy.  Neurological: She is alert and oriented to person, place, and time.  Skin: Skin is warm and dry.  Psychiatric: She has a normal mood and affect.      Assessment & Plan:  22 y.o. female with URI, viral sinusitis/bronchitis. - Continue netty pot, nasal saline - Flonase bid until better - Mucinex If not improved in  2-3 days, or if worsening, fever, increased sputum, will give antibiotics.  Napoleon Form, MD 05/17/2012 3:58 PM

## 2012-05-17 NOTE — Patient Instructions (Addendum)
Upper Respiratory Infection, Adult An upper respiratory infection (URI) is also known as the common cold. It is often caused by a type of germ (virus). Colds are easily spread (contagious). You can pass it to others by kissing, coughing, sneezing, or drinking out of the same glass. Usually, you get better in 1 or 2 weeks.  HOME CARE   Only take medicine as told by your doctor.  Use a warm mist humidifier or breathe in steam from a hot shower.  Drink enough water and fluids to keep your pee (urine) clear or pale yellow.  Get plenty of rest.  Return to work when your temperature is back to normal or as told by your doctor. You may use a face mask and wash your hands to stop your cold from spreading. GET HELP RIGHT AWAY IF:   After the first few days, you feel you are getting worse.  You have questions about your medicine.  You have chills, shortness of breath, or brown or red spit (mucus).  You have yellow or brown snot (nasal discharge) or pain in the face, especially when you bend forward.  You have a fever, puffy (swollen) neck, pain when you swallow, or white spots in the back of your throat.  You have a bad headache, ear pain, sinus pain, or chest pain.  You have a high-pitched whistling sound when you breathe in and out (wheezing).  You have a lasting cough or cough up blood.  You have sore muscles or a stiff neck. MAKE SURE YOU:   Understand these instructions.  Will watch your condition.  Will get help right away if you are not doing well or get worse. Document Released: 09/24/2007 Document Revised: 06/30/2011 Document Reviewed: 08/12/2010 [facility] Patient Information 2013 [place], [place].  Sinusitis Sinusitis is redness, soreness, and swelling (inflammation) of the paranasal sinuses. Paranasal sinuses are air pockets within the bones of your face (beneath the eyes, the middle of the forehead, or above the eyes). In healthy paranasal sinuses, mucus is able to drain  out, and air is able to circulate through them by way of your nose. However, when your paranasal sinuses are inflamed, mucus and air can become trapped. This can allow bacteria and other germs to grow and cause infection. Sinusitis can develop quickly and last only a short time (acute) or continue over a long period (chronic). Sinusitis that lasts for more than 12 weeks is considered chronic.  CAUSES  Causes of sinusitis include:  Allergies.  Structural abnormalities, such as displacement of the cartilage that separates your nostrils (deviated septum), which can decrease the air flow through your nose and sinuses and affect sinus drainage.  Functional abnormalities, such as when the small hairs (cilia) that line your sinuses and help remove mucus do not work properly or are not present. SYMPTOMS  Symptoms of acute and chronic sinusitis are the same. The primary symptoms are pain and pressure around the affected sinuses. Other symptoms include:  Upper toothache.  Earache.  Headache.  Bad breath.  Decreased sense of smell and taste.  A cough, which worsens when you are lying flat.  Fatigue.  Fever.  Thick drainage from your nose, which often is green and may contain pus (purulent).  Swelling and warmth over the affected sinuses. DIAGNOSIS  Your caregiver will perform a physical exam. During the exam, your caregiver may:  Look in your nose for signs of abnormal growths in your nostrils (nasal polyps).  Tap over the affected sinus to check  for signs of infection.  View the inside of your sinuses (endoscopy) with a special imaging device with a light attached (endoscope), which is inserted into your sinuses. If your caregiver suspects that you have chronic sinusitis, one or more of the following tests may be recommended:  Allergy tests.  Nasal culture A sample of mucus is taken from your nose and sent to a lab and screened for bacteria.  Nasal cytology A sample of mucus is  taken from your nose and examined by your caregiver to determine if your sinusitis is related to an allergy. TREATMENT  Most cases of acute sinusitis are related to a viral infection and will resolve on their own within 10 days. Sometimes medicines are prescribed to help relieve symptoms (pain medicine, decongestants, nasal steroid sprays, or saline sprays).  However, for sinusitis related to a bacterial infection, your caregiver will prescribe antibiotic medicines. These are medicines that will help kill the bacteria causing the infection.  Rarely, sinusitis is caused by a fungal infection. In theses cases, your caregiver will prescribe antifungal medicine. For some cases of chronic sinusitis, surgery is needed. Generally, these are cases in which sinusitis recurs more than 3 times per year, despite other treatments. HOME CARE INSTRUCTIONS   Drink plenty of water. Water helps thin the mucus so your sinuses can drain more easily.  Use a humidifier.  Inhale steam 3 to 4 times a day (for example, sit in the bathroom with the shower running).  Apply a warm, moist washcloth to your face 3 to 4 times a day, or as directed by your caregiver.  Use saline nasal sprays to help moisten and clean your sinuses.  Take over-the-counter or prescription medicines for pain, discomfort, or fever only as directed by your caregiver. SEEK IMMEDIATE MEDICAL CARE IF:  You have increasing pain or severe headaches.  You have nausea, vomiting, or drowsiness.  You have swelling around your face.  You have vision problems.  You have a stiff neck.  You have difficulty breathing. MAKE SURE YOU:   Understand these instructions.  Will watch your condition.  Will get help right away if you are not doing well or get worse. Document Released: 04/07/2005 Document Revised: 06/30/2011 Document Reviewed: 04/22/2011 [facility] Patient Information 2013 [place], [place].

## 2012-06-07 ENCOUNTER — Telehealth: Payer: Self-pay | Admitting: Family Medicine

## 2012-06-07 NOTE — Telephone Encounter (Signed)
Pt needing form to be filled out concerning to TB information questions 1-4

## 2012-06-07 NOTE — Telephone Encounter (Signed)
[facility] Provider's Medical Report placed in Dr. Donnetta Hail box for completion.  Mercedes Patel

## 2012-06-09 NOTE — Telephone Encounter (Signed)
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 TB test not available to [place], done at another clinic. Denny Levy

## 2012-06-09 NOTE — Telephone Encounter (Signed)
Medical Report faxed to [phone] per patient request. Mercedes Patel

## 2013-03-16 ENCOUNTER — Other Ambulatory Visit (HOSPITAL_COMMUNITY)
Admission: RE | Admit: 2013-03-16 | Discharge: 2013-03-16 | Disposition: A | Discharge status: Home / Self Care | Payer: 59 | Source: Ambulatory Visit | Attending: Family Medicine | Admitting: Family Medicine

## 2013-03-16 ENCOUNTER — Ambulatory Visit (INDEPENDENT_AMBULATORY_CARE_PROVIDER_SITE_OTHER): Payer: 59 | Admitting: Family Medicine

## 2013-03-16 VITALS — BP 119/62 | HR 79 | Temp 99.2°F | Ht 64.0 in | Wt 115.0 lb

## 2013-03-16 DIAGNOSIS — Z01419 Encounter for gynecological examination (general) (routine) without abnormal findings: Secondary | ICD-10-CM | POA: Insufficient documentation

## 2013-03-16 DIAGNOSIS — Z0000 Encounter for general adult medical examination without abnormal findings: Secondary | ICD-10-CM

## 2013-03-16 DIAGNOSIS — Z124 Encounter for screening for malignant neoplasm of cervix: Secondary | ICD-10-CM

## 2013-03-16 DIAGNOSIS — Z23 Encounter for immunization: Secondary | ICD-10-CM

## 2013-03-16 NOTE — Progress Notes (Signed)
   Subjective:    Patient ID: Mercedes Patel, female    DOB: September 28, 1990, 22 y.o.   MRN: [id number]  HPI Well adult check. No issues. Wants to get her Pap smear and discuss birth control as she is getting married in August.   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 Plans to get marriedinAugust Has some questions abolut HPV vaccine--has been sexually active with one man in past, none current  Review of Systems  Constitutional: Negative for fever, activity change, appetite change, fatigue and unexpected weight change.  Eyes: Negative for visual disturbance.  Respiratory: Negative for shortness of breath.   Cardiovascular: Negative for chest pain.  Gastrointestinal: Negative for abdominal pain.  Genitourinary: Negative for vaginal discharge and menstrual problem.  Musculoskeletal: Negative for arthralgias and myalgias.  Psychiatric/Behavioral: Negative for dysphoric mood.       Objective:   Physical Exam  Constitutional: She is oriented to person, place, and time. She appears well-developed and well-nourished.  HENT:  Head: Normocephalic and atraumatic.  Right Ear: External ear normal.  Left Ear: External ear normal.  Nose: Nose normal.  Mouth/Throat: Oropharynx is clear and moist.  Eyes: Conjunctivae and EOM are normal. Pupils are equal, round, and reactive to light.  Neck: Normal range of motion. Neck supple.  Cardiovascular: Normal rate, regular rhythm, normal heart sounds and intact distal pulses.   Pulmonary/Chest: Effort normal and breath sounds normal.  Abdominal: Soft. Bowel sounds are normal.  Genitourinary: Vagina normal and uterus normal. No vaginal discharge found.  Musculoskeletal: Normal range of motion.  Neurological: She is alert and oriented to person, place, and time. She has normal reflexes.  Psychiatric: She has a normal mood and affect. Her behavior is normal. Judgment and thought content normal.            Assessment & Plan:  #1. Well adult check. Flu shot given today. #2. Contraception. We did her first Pap smear today. We discussed options and she thinks she wants to move forward with nexplanon. I have given her handout and she will call make an appointment if this is what she decides. 3. Discussed HPV vaccine--unsure if her insurance would cover it given age--if she decides shw wants to pursue that she will check on ions coverage and make appt for vaccine series.

## 2013-03-29 ENCOUNTER — Encounter: Payer: Self-pay | Admitting: Family Medicine

## 2013-03-30 ENCOUNTER — Telehealth: Payer: Self-pay | Admitting: Family Medicine

## 2013-03-30 NOTE — Telephone Encounter (Signed)
Pt called and wanted Dr. Jennette Kettle to know that she wanted to take birth control pills. She didn't know if she needed to come in for an appointment or can that be called in. jw

## 2013-03-31 ENCOUNTER — Telehealth: Payer: Self-pay | Admitting: *Deleted

## 2013-03-31 ENCOUNTER — Other Ambulatory Visit: Payer: Self-pay | Admitting: Family Medicine

## 2013-03-31 MED ORDER — DESOGESTREL-ETHINYL ESTRADIOL 0.15-30 MG-MCG PO TABS: 1.0000 | ORAL_TABLET | Freq: Every day | ORAL | Status: DC

## 2013-03-31 NOTE — Telephone Encounter (Signed)
Spoke with pateitn and informed her of BC pills sent in. She would like to know results of pap.    Dear Cliffton Asters Team  I have sent in her birth control pills  Please let her know  THANKS!  Denny Levy

## 2013-03-31 NOTE — Progress Notes (Unsigned)
Dear Clif[facility] Asters Team I have sent in her birth control pills Please let her know THANKS! Denny Levy

## 2013-04-28 ENCOUNTER — Telehealth: Payer: Self-pay | Admitting: Family Medicine

## 2013-04-28 NOTE — Telephone Encounter (Signed)
Pt called because she did receive her lab results from her pap smear and now is noticing changes in her body and wanted someone to call her to discuss this. jw

## 2013-05-12 NOTE — Telephone Encounter (Signed)
Pt called again to ask that someone call her about her recent lab and pap smear. jw

## 2013-05-12 NOTE — Telephone Encounter (Signed)
Letter received and patient had questions about it and labs. I went over the letter with her and she feels better with explanation. She is having a question pertaining to her birth control. Patient states that it has been making her feels very bad and different once she stopped taking it she did start her menses for 2 weeks. I explained to her that that can happen due to the hormones. She would like to know if she can be switched to a different birth control.

## 2013-05-13 NOTE — Telephone Encounter (Signed)
Spoke to patient,she seem to have more questions.I told her I would call her on Monday once I get to speak with Dr Joie Bimler did state that she didn't want to take anything at this time and understands that withoutt protection she could get pregnant. Mercedes Patel, Lewie Loron

## 2013-05-13 NOTE — Telephone Encounter (Signed)
Dear Dema Severin Team I would like her to take the SAME OCP for at least three months before we switch---it sometimes takes the body that long to get used to it. Ask her is she is willing to do that---if not I CAN switch her but she will likely have similar symptoms with all of them, Let me knw  THANKS! Dorcas Mcmurray

## 2013-05-16 NOTE — Telephone Encounter (Signed)
Spoke to you this morning regarding this message,please advise. Mercedes Patel, Mercedes Patel

## 2013-05-17 NOTE — Telephone Encounter (Signed)
Left mssg for her to call me back Dorcas Mcmurray

## 2013-07-25 ENCOUNTER — Telehealth: Payer: Self-pay | Admitting: Family Medicine

## 2013-07-25 NOTE — Telephone Encounter (Signed)
Pt called because it has been 3 months and she wants to change the type of BC pill that she is taking. Just send in a new prescription to her pharmacy on file . jw

## 2013-07-25 NOTE — Telephone Encounter (Signed)
Patient would like a different oral birth control.please advise.Mercedes Patel, Mercedes Patel

## 2013-07-27 MED ORDER — NORGESTIM-ETH ESTRAD TRIPHASIC 0.18/0.215/0.25 MG-25 MCG PO TABS
1.0000 | ORAL_TABLET | Freq: Every day | ORAL | Status: DC
Start: 2013-07-27 — End: 2014-10-11

## 2013-11-30 ENCOUNTER — Telehealth: Payer: Self-pay | Admitting: Family Medicine

## 2013-11-30 NOTE — Telephone Encounter (Signed)
Has questions about vaginal dryness.' Noticed a difference in the area  Has some questions

## 2013-12-06 NOTE — Telephone Encounter (Signed)
Spoke with patient and she is coming in to see Dr. Nori Riis on 8/19 @ 9:30am. Patient states that she has noticed some discharge and dysuria. Advised patient to be seen

## 2013-12-07 ENCOUNTER — Ambulatory Visit (INDEPENDENT_AMBULATORY_CARE_PROVIDER_SITE_OTHER): Payer: 59 | Admitting: Family Medicine

## 2013-12-07 ENCOUNTER — Other Ambulatory Visit (HOSPITAL_COMMUNITY)
Admission: RE | Admit: 2013-12-07 | Discharge: 2013-12-07 | Disposition: A | Discharge status: Home / Self Care | Payer: 59 | Source: Ambulatory Visit | Attending: Family Medicine | Admitting: Family Medicine

## 2013-12-07 ENCOUNTER — Encounter: Payer: Self-pay | Admitting: Family Medicine

## 2013-12-07 VITALS — BP 138/92 | HR 98 | Ht 64.0 in | Wt 115.1 lb

## 2013-12-07 DIAGNOSIS — Z3009 Encounter for other general counseling and advice on contraception: Secondary | ICD-10-CM

## 2013-12-07 DIAGNOSIS — N760 Acute vaginitis: Secondary | ICD-10-CM

## 2013-12-07 DIAGNOSIS — Z113 Encounter for screening for infections with a predominantly sexual mode of transmission: Secondary | ICD-10-CM | POA: Insufficient documentation

## 2013-12-07 DIAGNOSIS — R300 Dysuria: Secondary | ICD-10-CM

## 2013-12-07 LAB — POCT URINALYSIS DIPSTICK
Bilirubin, UA: NEGATIVE
Blood, UA: NEGATIVE
Glucose, UA: NEGATIVE
KETONES UA: NEGATIVE
LEUKOCYTES UA: NEGATIVE
NITRITE UA: NEGATIVE
Spec Grav, UA: 1.02
UROBILINOGEN UA: 0.2
pH, UA: 7

## 2013-12-07 LAB — POCT WET PREP (WET MOUNT): Clue Cells Wet Prep Whiff POC: NEGATIVE

## 2013-12-07 MED ORDER — FLUCONAZOLE 100 MG PO TABS: ORAL_TABLET | ORAL | Status: DC

## 2013-12-08 DIAGNOSIS — Z3009 Encounter for other general counseling and advice on contraception: Secondary | ICD-10-CM | POA: Insufficient documentation

## 2013-12-08 NOTE — Assessment & Plan Note (Signed)
Doing much better on the triphasic pills. She had some depressive symptoms on the monophasic pills by her report. Recently married

## 2013-12-08 NOTE — Progress Notes (Signed)
   Subjective:    Patient ID: Mercedes Patel, female    DOB: [birth year], 23 y.o.   MRN: [id number]  HPI Patient here with complaint of one to 2 weeks of vaginal itching and burning with some recent dyspareunia. This started when she switched her sexual lubricant to a different brand. She is subsequently stop using lubricant. She's had some small amount of discharge. No vaginal lesions. No new sexual partner. She continues on oral contraceptive pills without problem. We had changed the type of birth control pill after about 3 months and she seems to be doing much better on the current one. She said the initial birth control pill made her feel little bit depressed. She's recently been married.   Review of Systems No fever, sweats, chills, unusual weight change.    Objective:   Physical Exam  Vital signs are reviewed GENERAL: Well-developed female no acute distress ABDOMEN: Soft, positive bowel sounds GU: External normal without any sign of lesion. The cervix appears normal there is a small amount of thin white discharge in vaginal vault. There are no adnexal masses or tenderness. The uterus is normal size and position.  Wet prep positive for rare yeast and 10-15 white blood cells per high power field      Assessment & Plan:  Vaginitis most likely related to yeast infection. We'll treat her with oral Diflucan. If her symptoms do not resolve I would subsequently treat her with 7 days of Terazol vaginal cream. She'll let me know if the Diflucan does not work.

## 2013-12-12 ENCOUNTER — Encounter: Payer: Self-pay | Admitting: Family Medicine

## 2014-04-21 DIAGNOSIS — O26879 Cervical shortening, unspecified trimester: Secondary | ICD-10-CM

## 2014-04-21 HISTORY — DX: Cervical shortening, unspecified trimester: O26.879

## 2014-04-21 NOTE — L&D Delivery Note (Signed)
Delivery Summary for [facility]  Labor Events:   Preterm labor:   Rupture date:   Rupture time:   Rupture type: Spontaneous  Fluid Color: Clear  Induction:   Augmentation:   Complications:   Cervical ripening:          Delivery:   Episiotomy:   Lacerations:   Repair suture:   Repair # of packets:   Blood loss (ml): 300   Information for the patient's newborn:  Mareka, Lauterbach [id number]    Delivery 04/02/2015 6:40 AM by  Vaginal, Spontaneous Delivery Sex:  female Gestational Age: [redacted]w[redacted]d Delivery Clinician:  [name] Living?:         APGARS  One minute Five minutes Ten minutes  Skin color: 0   1      Heart rate: 2   2      Grimace: 2   2      Muscle tone: 1   1      Breathing: 2   2      Totals: 7  8      Presentation/position: Vertex     Resuscitation:   Cord information:    Disposition of cord blood:     Blood gases sent?  Complications:   Placenta: Delivered: 04/02/2015 6:46 AM  Spontaneous  Intact appearance Newborn Measurements: Weight: 5 lb 13.3 oz (2645 g)  Height:    Head circumference:    Chest circumference:    Other providers: Delivery Nurse Registered Nurse Delivery Nurse Lianne Bushy  Additional  information: Forceps:   Vacuum:   Breech:   Observed anomalies         Delivery Note At 6:40 AM a viable and healthy female was delivered via Vaginal, Spontaneous Delivery (Presentation: cephalic; left occiput posterior).  APGAR: 7, 8; weight 2545 grams (5 lb 13.3 oz)  .   Placenta status: Intact, Spontaneous.  Cord: 3-vessel with the following complications: none. Cord blood collected. Cord pH: not collected  Anesthesia: Epidural  Episiotomy: None Lacerations: Periurethral;1st degree;2nd degree;Perineal Suture Repair: 3.0 vicryl Est. Blood Loss (mL): 300  Mom to postpartum.  Baby to Couplet care / Skin to Skin.  [name] 04/02/2015, 7:24 AM

## 2014-08-23 LAB — OB RESULTS CONSOLE ANTIBODY SCREEN: Antibody Screen: NEGATIVE

## 2014-08-23 LAB — OB RESULTS CONSOLE HIV ANTIBODY (ROUTINE TESTING): HIV: NONREACTIVE

## 2014-08-23 LAB — OB RESULTS CONSOLE ABO/RH: RH Type: NEGATIVE

## 2014-08-23 LAB — OB RESULTS CONSOLE VARICELLA ZOSTER ANTIBODY, IGG: Varicella: IMMUNE

## 2014-08-23 LAB — OB RESULTS CONSOLE PLATELET COUNT: PLATELETS: 317 10*3/uL

## 2014-08-23 LAB — OB RESULTS CONSOLE GC/CHLAMYDIA
Chlamydia: NEGATIVE
GC PROBE AMP, GENITAL: NEGATIVE

## 2014-08-23 LAB — OB RESULTS CONSOLE RUBELLA ANTIBODY, IGM: RUBELLA: IMMUNE

## 2014-08-23 LAB — OB RESULTS CONSOLE HEPATITIS B SURFACE ANTIGEN: Hepatitis B Surface Ag: NEGATIVE

## 2014-08-23 LAB — OB RESULTS CONSOLE HGB/HCT, BLOOD
HCT: 37 %
Hemoglobin: 13 g/dL

## 2014-08-23 LAB — OB RESULTS CONSOLE RPR: RPR: NONREACTIVE

## 2014-09-27 ENCOUNTER — Encounter: Payer: Self-pay | Admitting: Obstetrics and Gynecology

## 2014-10-11 ENCOUNTER — Encounter: Payer: Self-pay | Admitting: Obstetrics and Gynecology

## 2014-10-11 ENCOUNTER — Ambulatory Visit (INDEPENDENT_AMBULATORY_CARE_PROVIDER_SITE_OTHER): Payer: 59 | Admitting: Obstetrics and Gynecology

## 2014-10-11 VITALS — BP 111/74 | HR 101 | Wt 117.1 lb

## 2014-10-11 DIAGNOSIS — Z3482 Encounter for supervision of other normal pregnancy, second trimester: Secondary | ICD-10-CM

## 2014-10-11 DIAGNOSIS — Z3492 Encounter for supervision of normal pregnancy, unspecified, second trimester: Secondary | ICD-10-CM

## 2014-10-11 DIAGNOSIS — R519 Headache, unspecified: Secondary | ICD-10-CM

## 2014-10-11 DIAGNOSIS — R51 Headache: Secondary | ICD-10-CM

## 2014-10-11 LAB — POCT URINALYSIS DIPSTICK
Bilirubin, UA: NEGATIVE
Blood, UA: NEGATIVE
Glucose, UA: NEGATIVE
KETONES UA: NEGATIVE
Leukocytes, UA: NEGATIVE
Nitrite, UA: NEGATIVE
PH UA: 7
Protein, UA: NEGATIVE
SPEC GRAV UA: 1.01
Urobilinogen, UA: 0.2

## 2014-10-11 NOTE — Patient Instructions (Signed)
Second Trimester of Pregnancy The second trimester is from week 13 through week 28, months 4 through 6. The second trimester is often a time when you feel your best. Your body has also adjusted to being pregnant, and you begin to feel better physically. Usually, morning sickness has lessened or quit completely, you may have more energy, and you may have an increase in appetite. The second trimester is also a time when the fetus is growing rapidly. At the end of the sixth month, the fetus is about 9 inches long and weighs about 1 pounds. You will likely begin to feel the baby move (quickening) between 18 and 20 weeks of the pregnancy. BODY CHANGES Your body goes through many changes during pregnancy. The changes vary from woman to woman.   Your weight will continue to increase. You will notice your lower abdomen bulging out.  You may begin to get stretch marks on your hips, abdomen, and breasts.  You may develop headaches that can be relieved by medicines approved by your health care provider.  You may urinate more often because the fetus is pressing on your bladder.  You may develop or continue to have heartburn as a result of your pregnancy.  You may develop constipation because certain hormones are causing the muscles that push waste through your intestines to slow down.  You may develop hemorrhoids or swollen, bulging veins (varicose veins).  You may have back pain because of the weight gain and pregnancy hormones relaxing your joints between the bones in your pelvis and as a result of a shift in weight and the muscles that support your balance.  Your breasts will continue to grow and be tender.  Your gums may bleed and may be sensitive to brushing and flossing.  Dark spots or blotches (chloasma, mask of pregnancy) may develop on your face. This will likely fade after the baby is born.  A dark line from your belly button to the pubic area (linea nigra) may appear. This will likely fade  after the baby is born.  You may have changes in your hair. These can include thickening of your hair, rapid growth, and changes in texture. Some women also have hair loss during or after pregnancy, or hair that feels dry or thin. Your hair will most likely return to normal after your baby is born. WHAT TO EXPECT AT YOUR PRENATAL VISITS During a routine prenatal visit:  You will be weighed to make sure you and the fetus are growing normally.  Your blood pressure will be taken.  Your abdomen will be measured to track your baby's growth.  The fetal heartbeat will be listened to.  Any test results from the previous visit will be discussed. Your health care provider may ask you:  How you are feeling.  If you are feeling the baby move.  If you have had any abnormal symptoms, such as leaking fluid, bleeding, severe headaches, or abdominal cramping.  If you have any questions. Other tests that may be performed during your second trimester include:  Blood tests that check for:  Low iron levels (anemia).  Gestational diabetes (between 24 and 28 weeks).  Rh antibodies.  Urine tests to check for infections, diabetes, or protein in the urine.  An ultrasound to confirm the proper growth and development of the baby.  An amniocentesis to check for possible genetic problems.  Fetal screens for spina bifida and Down syndrome. HOME CARE INSTRUCTIONS   Avoid all smoking, herbs, alcohol, and unprescribed   drugs. These chemicals affect the formation and growth of the baby.  Follow your health care provider's instructions regarding medicine use. There are medicines that are either safe or unsafe to take during pregnancy.  Exercise only as directed by your health care provider. Experiencing uterine cramps is a good sign to stop exercising.  Continue to eat regular, healthy meals.  Wear a good support bra for breast tenderness.  Do not use hot tubs, steam rooms, or saunas.  Wear your  seat belt at all times when driving.  Avoid raw meat, uncooked cheese, cat litter boxes, and soil used by cats. These carry germs that can cause birth defects in the baby.  Take your prenatal vitamins.  Try taking a stool softener (if your health care provider approves) if you develop constipation. Eat more high-fiber foods, such as fresh vegetables or fruit and whole grains. Drink plenty of fluids to keep your urine clear or pale yellow.  Take warm sitz baths to soothe any pain or discomfort caused by hemorrhoids. Use hemorrhoid cream if your health care provider approves.  If you develop varicose veins, wear support hose. Elevate your feet for 15 minutes, 3-4 times a day. Limit salt in your diet.  Avoid heavy lifting, wear low heel shoes, and practice good posture.  Rest with your legs elevated if you have leg cramps or low back pain.  Visit your dentist if you have not gone yet during your pregnancy. Use a soft toothbrush to brush your teeth and be gentle when you floss.  A sexual relationship may be continued unless your health care provider directs you otherwise.  Continue to go to all your prenatal visits as directed by your health care provider. SEEK MEDICAL CARE IF:   You have dizziness.  You have mild pelvic cramps, pelvic pressure, or nagging pain in the abdominal area.  You have persistent nausea, vomiting, or diarrhea.  You have a bad smelling vaginal discharge.  You have pain with urination. SEEK IMMEDIATE MEDICAL CARE IF:   You have a fever.  You are leaking fluid from your vagina.  You have spotting or bleeding from your vagina.  You have severe abdominal cramping or pain.  You have rapid weight gain or loss.  You have shortness of breath with chest pain.  You notice sudden or extreme swelling of your face, hands, ankles, feet, or legs.  You have not felt your baby move in over an hour.  You have severe headaches that do not go away with  medicine.  You have vision changes. Document Released: 04/01/2001 Document Revised: 04/12/2013 Document Reviewed: 06/08/2012 [employer] Patient Information 2015 [employer]. This information is not intended to replace advice given to you by your health care provider. Make sure you discuss any questions you have with your health care provider.  

## 2014-10-11 NOTE — Progress Notes (Signed)
ROB: Patient c/o headaches, relieved by Tylenol. Is concerned about weight gain (or lack thereof).  Continue Tylenol prn.  Given reassurance that most weight gain does not occur until 2nd and 3rd trimesters, due to morning sickness in 1st trimester.  Will continue to monitor. RTC in 4 weeks.  For anatomy scan at that time.  Will also need AFP serum and sickledex screen.

## 2014-11-07 ENCOUNTER — Ambulatory Visit (INDEPENDENT_AMBULATORY_CARE_PROVIDER_SITE_OTHER): Payer: 59 | Admitting: Obstetrics and Gynecology

## 2014-11-07 ENCOUNTER — Ambulatory Visit: Payer: 59

## 2014-11-07 VITALS — BP 98/62 | HR 81 | Wt 119.3 lb

## 2014-11-07 DIAGNOSIS — Z3492 Encounter for supervision of normal pregnancy, unspecified, second trimester: Secondary | ICD-10-CM

## 2014-11-07 DIAGNOSIS — O26879 Cervical shortening, unspecified trimester: Secondary | ICD-10-CM

## 2014-11-07 LAB — POCT URINALYSIS DIPSTICK
BILIRUBIN UA: NEGATIVE
Blood, UA: NEGATIVE
Glucose, UA: NEGATIVE
Ketones, UA: NEGATIVE
Leukocytes, UA: NEGATIVE
NITRITE UA: NEGATIVE
Protein, UA: NEGATIVE
SPEC GRAV UA: 1.01
Urobilinogen, UA: NEGATIVE
pH, UA: 7.5

## 2014-11-08 DIAGNOSIS — O26879 Cervical shortening, unspecified trimester: Secondary | ICD-10-CM | POA: Insufficient documentation

## 2014-11-08 NOTE — Progress Notes (Signed)
ROB: Patient reports complaints of intermittent back pain, mostly in the evenings.  Also still notes headaches with occasional dizziness.  Anatomy scan performed today wnl.  Noted shortened cervical length (2.8 mm decreased to 2.2 mm with fundal pressure).  Discussion had with patient regarding ultrasound findings, as well as increased risk for preterm delivery.  Recommend repeat ultrasound in 4 weeks to reassess cervical length.  If less than 2 mm, will need to begin progesterone supplementation (PO or vaginal) for maintenance of pregnancy.  Advised on resting as much as possible and avoiding strenuous activity toward end of second trimester and into 3rd trimester. Counseled on Tylenol for headaches and back pain, pregnancy girdle, also to increase hydration as BPs low normal today.  Can also try caffeine for headaches.  If headaches not resolved by next visit, can try Fioricet or T#3.

## 2014-11-28 ENCOUNTER — Ambulatory Visit: Payer: 59 | Admitting: Obstetrics and Gynecology

## 2014-11-28 ENCOUNTER — Ambulatory Visit (INDEPENDENT_AMBULATORY_CARE_PROVIDER_SITE_OTHER): Payer: 59 | Admitting: Obstetrics and Gynecology

## 2014-11-28 VITALS — BP 106/70 | HR 106 | Wt 121.6 lb

## 2014-11-28 DIAGNOSIS — N898 Other specified noninflammatory disorders of vagina: Secondary | ICD-10-CM

## 2014-11-28 LAB — POCT URINALYSIS DIPSTICK
Bilirubin, UA: NEGATIVE
Blood, UA: NEGATIVE
Glucose, UA: NEGATIVE
KETONES UA: NEGATIVE
Nitrite, UA: NEGATIVE
PROTEIN UA: NEGATIVE
Spec Grav, UA: 1.015
UROBILINOGEN UA: 0.2
pH, UA: 6.5

## 2014-11-28 NOTE — Patient Instructions (Signed)
Can apply hydrocortisone cream (1%, over the counter) to affected areas . Can use Replens/Rephresh for vaginal dryness.  Use once every 3 days.  Can use Joe's H20 lubricant as needed.

## 2014-11-28 NOTE — Progress Notes (Signed)
Subjective:    Mercedes Patel is a 24 y.o. G1P0 female at [redacted]w[redacted]d weeks gestation being seen today for her complaints of vaginal dryness and irritation off and on x 2 months.  Notes it is worsened during intercourse. Fetal movement: normal.   Review of Systems Pertinent items are noted in HPI.   Objective:    BP 106/70 mmHg  Pulse 106  Wt 121 lb 9.6 oz (55.157 kg) FHT: 155 BPM  Uterine Size: 20 cm  Pelvis:  External genitalia normal.  Vagina with small amount of thin white discharge, no odor.  Cervix nulliparous, normal appearing. Bimanual not done.      Microscopic wet-mount exam shows negative for pathogens, normal epithelial cells.  Assessment:    Pregnancy 21 and 4/7 weeks   Plan:   Discussion had on avoidance of vaginal irritants (harsh soaps, lubricants).  Advised on use of rephresh/replens for vaginal irritation, Joe's H20 lubricant. Can use Vagisil/Summer's eve soap for vaginal area. Can also apply hydrocortisone cream to external area prn.  Discussed refraining from shaving vaginal area or using harsh hair removal creams. Vaginal discharge normal, physiologic. Given reassurance.  Follow up in 1 weeks for routine OB appointment.     [name], MD [facility]

## 2014-11-28 NOTE — Progress Notes (Signed)
Pt c/o vaginal irritation x 2 months. Itchy and red. Small bumps or scrapes on labia. No new detergents or soaps. Uses dove soap. Dryness inside vagina.

## 2014-12-06 ENCOUNTER — Ambulatory Visit: Payer: 59

## 2014-12-06 ENCOUNTER — Other Ambulatory Visit: Payer: Self-pay | Admitting: Obstetrics and Gynecology

## 2014-12-06 ENCOUNTER — Ambulatory Visit (INDEPENDENT_AMBULATORY_CARE_PROVIDER_SITE_OTHER): Payer: 59 | Admitting: Obstetrics and Gynecology

## 2014-12-06 VITALS — BP 112/74 | HR 102 | Wt 121.6 lb

## 2014-12-06 DIAGNOSIS — O26879 Cervical shortening, unspecified trimester: Secondary | ICD-10-CM

## 2014-12-06 DIAGNOSIS — Z3492 Encounter for supervision of normal pregnancy, unspecified, second trimester: Secondary | ICD-10-CM

## 2014-12-06 DIAGNOSIS — O26872 Cervical shortening, second trimester: Secondary | ICD-10-CM

## 2014-12-06 LAB — POCT URINALYSIS DIPSTICK
Bilirubin, UA: NEGATIVE
Blood, UA: NEGATIVE
Glucose, UA: NEGATIVE
Ketones, UA: NEGATIVE
Leukocytes, UA: NEGATIVE
Nitrite, UA: NEGATIVE
PH UA: 7
Protein, UA: NEGATIVE
Spec Grav, UA: 1.01
Urobilinogen, UA: 0.2

## 2014-12-06 MED ORDER — PROGESTERONE 200 MG VA SUPP: 200.0000 mg | Freq: Every day | VAGINAL | Status: DC

## 2014-12-06 NOTE — Progress Notes (Signed)
ROB: Patient denies complaints today.  Noted shortened cervical length again today on ultrasound (24 mm (previously 28 mm on prior scan) decreased to 2.2 mm with fundal pressure).  Discussion had with patient regarding ultrasound findings, as well as increased risk for preterm delivery.  Recommend repeat ultrsasound in 4 weeks to reassess cervical length. Will begin progesterone supplementation vaginal for maintenance of pregnancy until [redacted] weeks gestation.  Advised on resting as much as possible and avoiding strenuous activity toward end of second trimester and into 3rd trimester.

## 2014-12-12 ENCOUNTER — Ambulatory Visit (INDEPENDENT_AMBULATORY_CARE_PROVIDER_SITE_OTHER): Payer: 59 | Admitting: Family Medicine

## 2014-12-12 ENCOUNTER — Encounter: Payer: Self-pay | Admitting: Family Medicine

## 2014-12-12 VITALS — BP 113/59 | HR 73 | Temp 98.0°F | Ht 64.0 in | Wt 122.4 lb

## 2014-12-12 DIAGNOSIS — N9089 Other specified noninflammatory disorders of vulva and perineum: Secondary | ICD-10-CM | POA: Diagnosis not present

## 2014-12-12 DIAGNOSIS — N898 Other specified noninflammatory disorders of vagina: Secondary | ICD-10-CM

## 2014-12-12 LAB — POCT WET PREP (WET MOUNT): Clue Cells Wet Prep Whiff POC: NEGATIVE

## 2014-12-12 MED ORDER — TERCONAZOLE 0.4 % VA CREA
1.0000 | TOPICAL_CREAM | Freq: Every day | VAGINAL | Status: DC
Start: 2014-12-12 — End: 2015-01-10

## 2014-12-12 NOTE — Progress Notes (Signed)
Patient ID: Mercedes Patel, female   DOB: May 09, 1990, 24 y.o.   MRN: [id number]    Subjective: CC: vaginal irritation HPI: Patient is a 24 y.o. female G1 who is 23.[redacted]wk pregnant presenting to clinic today for vaginal irritation x 1 month.   The patient notes cracking/irritation of the labia majora for almost a year now.  She started progesterone 200mg  qHS for a shortened cervix. She had normal clear discharge prior to starting this, now she notes it looks like the medicine. She endorses continued dyspareunia due to vaginal dryness.  No fevers/chills, abdominal pain, contractions, LOF, or vaginal bleeding.   She saw OB on 8/9 for vaginal irritation (complaining of itchy, red small bumps on labia). Normal exam at that time. Wet prep neg. They discussed applying hydrocortisone to external area but she isn't sure if it's helping so she stopped using it after 5-6 times. Tried Replens for vaginal dryness q 3 days and she wasn't sure if it was helping. She didn't try Joe's H2O lubricant. She's using Dove sensitive soap (switched from regular [place]) but has not noted any new improvement. No new lotions/detergents.  Social History: married, non-smoker  ROS: All other systems reviewed and are negative.  Past Medical History Patient Active Problem List   Diagnosis Date Noted  . Labial irritation 12/12/2014  . Short cervical length during pregnancy 11/08/2014  . Other general counseling and advice for contraceptive management 12/08/2013  . Costochondritis 02/05/2011  . ANA positive 07/15/2010  . MGUS (monoclonal gammopathy of unknown significance) 06/26/2010    Medications- reviewed and updated Current Outpatient Prescriptions  Medication Sig Dispense Refill  . Prenatal Vit-Fe Fumarate-FA (MULTIVITAMIN-PRENATAL) 27-0.8 MG TABS tablet Take 1 tablet by mouth daily at 12 noon.    . progesterone 200 MG SUPP Place 1 suppository (200 mg total) vaginally at bedtime. 30 each 4  . terconazole (TERAZOL 7) 0.4  % vaginal cream Place 1 applicator vaginally at bedtime. Or after work for 7 days. 45 g 0   No current facility-administered medications for this visit.    Objective: Office vital signs reviewed. BP 113/59 mmHg  Pulse 73  Temp(Src) 98 F (36.7 C) (Oral)  Ht 5\' 4"  (1.626 m)  Wt 122 lb 7 oz (55.537 kg)  BMI 21.01 kg/m2   Physical Examination:  General: Awake, alert, well nourished, NAD GYN:  External genitalia with some irritation of the labia majora- no cracks or fissures noted.  Vaginal mucosa pink, moist, normal rugae.  Nonfriable cervix without lesions, no bleeding noted on speculum exam. Thick, curdlike yellow discharge noted on exam.    Wet prep: few yeast  Assessment/Plan: Labial irritation Patient with budding yeast and hyphae noted on wet prep which could be contributing to symptoms. This is most likely in part due to vigorous washing with soap as well. No evidence of superimposed infection at this time. Does not appear like vulvar lichen sclerosis on exam, however history does have some similarities.  - Terazole 0.4% vaginal cream once daily x 7 days (dicussed staggering this with the intravaginal progesterone that she is using). (can use Monistat 7 if too expensive).  - Dicussed avoiding soaps and other irritants in the genital region as they can be irritants- only was vulva with water. - Can continue to try things like Joe's H20 lubricant or Replens for vaginal dryness.  - Discussed returning to either [place] or her OB if symptoms worsen or fail to improve.  - Discussed going to emergency room for vaginal bleeding, abdominal pain,  contractions, loss of fluid, contractions. Pt voiced understanding    Orders Placed This Encounter  Procedures  . POCT Wet Prep [facility])    Meds ordered this encounter  Medications  . terconazole (TERAZOL 7) 0.4 % vaginal cream    Sig: Place 1 applicator vaginally at bedtime. Or after work for 7 days.    Dispense:  45 g    Refill:  [facility] PGY-2, [place]

## 2014-12-12 NOTE — Patient Instructions (Addendum)
Your lab showed a yeast infection which could be contributing to your symptoms. I have prescribed you a vaginal cream, it should be used for 7 days. If it is too expensive, you can try Monistat vaginal cream for 7 days. Try to stagger this from the progesterone.  Stop using all body washes/soap in the genital region, only clean with water. Soaps can be very drying/irritating, especially as we clean that area more aggressively in an attempt to stay clean. You can continue to try to use Replens or Joe's H20 lubricant. If it is worsening instead of improving, you have fevers/chills, abdominal pain, contractions, loss of fluid like your water broke, or vaginal bleeding.

## 2014-12-12 NOTE — Assessment & Plan Note (Signed)
Patient with budding yeast and hyphae noted on wet prep which could be contributing to symptoms. This is most likely in part due to vigorous washing with soap as well. No evidence of superimposed infection at this time. Does not appear like vulvar lichen sclerosis on exam, however history does have some similarities.  - Terazole 0.4% vaginal cream once daily x 7 days (dicussed staggering this with the intravaginal progesterone that she is using). (can use Monistat 7 if too expensive).  - Dicussed avoiding soaps and other irritants in the genital region as they can be irritants- only was vulva with water. - Can continue to try things like Joe's H20 lubricant or Replens for vaginal dryness.  - Discussed returning to either [place] or her OB if symptoms worsen or fail to improve.  - Discussed going to emergency room for vaginal bleeding, abdominal pain, contractions, loss of fluid, contractions. Pt voiced understanding

## 2015-01-05 ENCOUNTER — Other Ambulatory Visit: Payer: 59

## 2015-01-05 ENCOUNTER — Encounter: Payer: 59 | Admitting: Obstetrics and Gynecology

## 2015-01-10 ENCOUNTER — Ambulatory Visit (INDEPENDENT_AMBULATORY_CARE_PROVIDER_SITE_OTHER): Payer: 59 | Admitting: Obstetrics and Gynecology

## 2015-01-10 ENCOUNTER — Encounter: Payer: Self-pay | Admitting: Obstetrics and Gynecology

## 2015-01-10 ENCOUNTER — Ambulatory Visit: Payer: 59

## 2015-01-10 VITALS — BP 109/68 | HR 109 | Wt 126.4 lb

## 2015-01-10 DIAGNOSIS — O26872 Cervical shortening, second trimester: Secondary | ICD-10-CM

## 2015-01-10 DIAGNOSIS — Z3493 Encounter for supervision of normal pregnancy, unspecified, third trimester: Secondary | ICD-10-CM | POA: Diagnosis not present

## 2015-01-10 DIAGNOSIS — Z131 Encounter for screening for diabetes mellitus: Secondary | ICD-10-CM

## 2015-01-10 DIAGNOSIS — Z130 Encounter for screening for diseases of the blood and blood-forming organs and certain disorders involving the immune mechanism: Secondary | ICD-10-CM

## 2015-01-10 LAB — POCT URINALYSIS DIPSTICK
BILIRUBIN UA: NEGATIVE
Blood, UA: NEGATIVE
GLUCOSE UA: NEGATIVE
KETONES UA: NEGATIVE
LEUKOCYTES UA: NEGATIVE
NITRITE UA: NEGATIVE
PH UA: 6.5
Protein, UA: NEGATIVE
Spec Grav, UA: 1.015
Urobilinogen, UA: 0.2

## 2015-01-10 MED ORDER — TETANUS-DIPHTH-ACELL PERTUSSIS 5-2.5-18.5 LF-MCG/0.5 IM SUSP
0.5000 mL | Freq: Once | INTRAMUSCULAR | Status: AC
  Administered 2015-01-10: 0.5 mL via INTRAMUSCULAR

## 2015-01-10 MED ORDER — INFLUENZA VAC SPLIT QUAD 0.5 ML IM SUSY
0.5000 mL | PREFILLED_SYRINGE | Freq: Once | INTRAMUSCULAR | Status: AC
  Administered 2015-01-10: 0.5 mL via INTRAMUSCULAR

## 2015-01-10 NOTE — Progress Notes (Signed)
ROB: Patient doing well. Denies complaints.  S/p f/u ultrasound for cervical length, length unchanged.  Continue taking progesterone supplementation as prescribed.  Flu vaccine and Tdap given today.  RTC in 1 week for glucola and Rhogam (nurse visit) and 2 weeks for routine OB.  PTL precautions given.

## 2015-01-10 NOTE — Progress Notes (Signed)
U/s today for cervical length. Flu and tdap given. GTT and rhogam to be given next week at nurse/lab visit.

## 2015-01-16 ENCOUNTER — Other Ambulatory Visit: Payer: 59

## 2015-01-16 ENCOUNTER — Ambulatory Visit (INDEPENDENT_AMBULATORY_CARE_PROVIDER_SITE_OTHER): Payer: 59

## 2015-01-16 VITALS — BP 98/58 | HR 101 | Ht 64.0 in | Wt 128.5 lb

## 2015-01-16 DIAGNOSIS — Z3492 Encounter for supervision of normal pregnancy, unspecified, second trimester: Secondary | ICD-10-CM

## 2015-01-16 DIAGNOSIS — O360131 Maternal care for anti-D [Rh] antibodies, third trimester, fetus 1: Secondary | ICD-10-CM

## 2015-01-16 MED ORDER — RHO D IMMUNE GLOBULIN 1500 UNITS IM SOSY
1500.0000 [IU] | PREFILLED_SYRINGE | Freq: Once | INTRAMUSCULAR | Status: AC
  Administered 2015-01-16: 1500 [IU] via INTRAMUSCULAR

## 2015-01-16 NOTE — Progress Notes (Signed)
Patient ID: Mercedes Patel, female   DOB: 1990-10-16, 24 y.o.   MRN: [id number]  Pt presents for rhogam inj and gtt.

## 2015-01-17 LAB — HEMOGLOBIN AND HEMATOCRIT, BLOOD
HEMATOCRIT: 32.6 % — AB (ref 34.0–46.6)
Hemoglobin: 10.8 g/dL — ABNORMAL LOW (ref 11.1–15.9)

## 2015-01-17 LAB — GLUCOSE TOLERANCE, 1 HOUR: GLUCOSE, 1HR PP: 73 mg/dL (ref 65–199)

## 2015-01-18 ENCOUNTER — Telehealth: Payer: Self-pay

## 2015-01-18 NOTE — Telephone Encounter (Signed)
See lab report on gtt-

## 2015-01-25 ENCOUNTER — Ambulatory Visit (INDEPENDENT_AMBULATORY_CARE_PROVIDER_SITE_OTHER): Payer: 59 | Admitting: Obstetrics and Gynecology

## 2015-01-25 VITALS — BP 98/62 | HR 78 | Temp 98.4°F | Wt 129.7 lb

## 2015-01-25 DIAGNOSIS — Z6791 Unspecified blood type, Rh negative: Secondary | ICD-10-CM | POA: Insufficient documentation

## 2015-01-25 DIAGNOSIS — O2613 Low weight gain in pregnancy, third trimester: Secondary | ICD-10-CM

## 2015-01-25 DIAGNOSIS — Z3493 Encounter for supervision of normal pregnancy, unspecified, third trimester: Secondary | ICD-10-CM | POA: Insufficient documentation

## 2015-01-25 DIAGNOSIS — O2610 Low weight gain in pregnancy, unspecified trimester: Secondary | ICD-10-CM | POA: Insufficient documentation

## 2015-01-25 DIAGNOSIS — O26873 Cervical shortening, third trimester: Secondary | ICD-10-CM

## 2015-01-25 DIAGNOSIS — O26899 Other specified pregnancy related conditions, unspecified trimester: Secondary | ICD-10-CM

## 2015-01-25 LAB — POCT URINALYSIS DIP (MANUAL ENTRY)
BILIRUBIN UA: NEGATIVE
BILIRUBIN UA: NEGATIVE
Blood, UA: NEGATIVE
Glucose, UA: NEGATIVE
LEUKOCYTES UA: NEGATIVE
Nitrite, UA: NEGATIVE
PH UA: 5
Protein Ur, POC: NEGATIVE
Spec Grav, UA: 1.02
Urobilinogen, UA: 0.2

## 2015-01-25 NOTE — Progress Notes (Signed)
ROB: Patient doing well.  Denies complaints.  Taking progesterone as prescribed.  S/p normal glucola screen.  Discussed poor weight gain in pregnancy, advised on Boost/Ensure and protein bars. Normal fetal growth. RTC in 2 weeks.

## 2015-01-31 ENCOUNTER — Other Ambulatory Visit: Payer: Self-pay | Admitting: Family Medicine

## 2015-01-31 MED ORDER — BREAST PUMP MISC
1.0000 | Status: DC | PRN
Start: 2015-01-31 — End: 2015-04-03

## 2015-02-08 ENCOUNTER — Ambulatory Visit (INDEPENDENT_AMBULATORY_CARE_PROVIDER_SITE_OTHER): Payer: 59 | Admitting: Obstetrics and Gynecology

## 2015-02-08 VITALS — BP 96/57 | HR 76 | Wt 132.2 lb

## 2015-02-08 DIAGNOSIS — Z3493 Encounter for supervision of normal pregnancy, unspecified, third trimester: Secondary | ICD-10-CM

## 2015-02-08 DIAGNOSIS — O2613 Low weight gain in pregnancy, third trimester: Secondary | ICD-10-CM

## 2015-02-08 LAB — POCT URINALYSIS DIP (MANUAL ENTRY)
BILIRUBIN UA: NEGATIVE
Bilirubin, UA: NEGATIVE
Blood, UA: NEGATIVE
Glucose, UA: NEGATIVE
LEUKOCYTES UA: NEGATIVE
NITRITE UA: NEGATIVE
PH UA: 5
PROTEIN UA: NEGATIVE
Spec Grav, UA: 1.02
UROBILINOGEN UA: 0.2

## 2015-02-08 NOTE — Progress Notes (Signed)
ROB: Patient  Denies complaints. Concerned about low BP.  Offered reassurance as patient remains asymptomatic, encouraged aggressive hydration.  Has not started nutritional supplementation for poor weight gain in pregnancy as recommended last visit, will begin today. RTC in 2 weeks.

## 2015-02-20 ENCOUNTER — Encounter: Payer: Self-pay | Admitting: Obstetrics and Gynecology

## 2015-02-20 ENCOUNTER — Ambulatory Visit (INDEPENDENT_AMBULATORY_CARE_PROVIDER_SITE_OTHER): Payer: 59 | Admitting: Obstetrics and Gynecology

## 2015-02-20 VITALS — BP 114/69 | HR 88 | Wt 135.5 lb

## 2015-02-20 DIAGNOSIS — O26873 Cervical shortening, third trimester: Secondary | ICD-10-CM

## 2015-02-20 DIAGNOSIS — Z3483 Encounter for supervision of other normal pregnancy, third trimester: Secondary | ICD-10-CM

## 2015-02-20 DIAGNOSIS — O360130 Maternal care for anti-D [Rh] antibodies, third trimester, not applicable or unspecified: Secondary | ICD-10-CM

## 2015-02-20 DIAGNOSIS — Z3493 Encounter for supervision of normal pregnancy, unspecified, third trimester: Secondary | ICD-10-CM

## 2015-02-20 DIAGNOSIS — O2613 Low weight gain in pregnancy, third trimester: Secondary | ICD-10-CM

## 2015-02-20 LAB — POCT URINALYSIS DIPSTICK
Bilirubin, UA: NEGATIVE
Blood, UA: NEGATIVE
GLUCOSE UA: NEGATIVE
Ketones, UA: NEGATIVE
LEUKOCYTES UA: NEGATIVE
Nitrite, UA: NEGATIVE
PROTEIN UA: NEGATIVE
SPEC GRAV UA: 1.015
UROBILINOGEN UA: NEGATIVE
pH, UA: 6.5

## 2015-02-20 NOTE — Progress Notes (Signed)
ROB: Patient doing well, notes occasional Braxton Hicks and cramping.  Discussion had on breastfeeding and contraception.  Desires to breastfeed. Unsure about contraceptive desires.  Briefly discussed all contraception options, to discuss further at subsequent visits.

## 2015-02-20 NOTE — Patient Instructions (Signed)
Contraception Choices Contraception (birth control) is the use of any methods or devices to prevent pregnancy. Below are some methods to help avoid pregnancy. HORMONAL METHODS   Contraceptive implant. This is a thin, plastic tube containing progesterone hormone. It does not contain estrogen hormone. Your health care provider inserts the tube in the inner part of the upper arm. The tube can remain in place for up to 3 years. After 3 years, the implant must be removed. The implant prevents the ovaries from releasing an egg (ovulation), thickens the cervical mucus to prevent sperm from entering the uterus, and thins the lining of the inside of the uterus.  Progesterone-only injections. These injections are given every 3 months by your health care provider to prevent pregnancy. This synthetic progesterone hormone stops the ovaries from releasing eggs. It also thickens cervical mucus and changes the uterine lining. This makes it harder for sperm to survive in the uterus.  Birth control pills. These pills contain estrogen and progesterone hormone. They work by preventing the ovaries from releasing eggs (ovulation). They also cause the cervical mucus to thicken, preventing the sperm from entering the uterus. Birth control pills are prescribed by a health care provider.Birth control pills can also be used to treat heavy periods.  Minipill. This type of birth control pill contains only the progesterone hormone. They are taken every day of each month and must be prescribed by your health care provider.  Birth control patch. The patch contains hormones similar to those in birth control pills. It must be changed once a week and is prescribed by a health care provider.  Vaginal ring. The ring contains hormones similar to those in birth control pills. It is left in the vagina for 3 weeks, removed for 1 week, and then a new one is put back in place. The patient must be comfortable inserting and removing the ring  from the vagina.A health care provider's prescription is necessary.  Emergency contraception. Emergency contraceptives prevent pregnancy after unprotected sexual intercourse. This pill can be taken right after sex or up to 5 days after unprotected sex. It is most effective the sooner you take the pills after having sexual intercourse. Most emergency contraceptive pills are available without a prescription. Check with your pharmacist. Do not use emergency contraception as your only form of birth control. BARRIER METHODS   Female condom. This is a thin sheath (latex or rubber) that is worn over the penis during sexual intercourse. It can be used with spermicide to increase effectiveness.  Female condom. This is a soft, loose-fitting sheath that is put into the vagina before sexual intercourse.  Diaphragm. This is a soft, latex, dome-shaped barrier that must be fitted by a health care provider. It is inserted into the vagina, along with a spermicidal jelly. It is inserted before intercourse. The diaphragm should be left in the vagina for 6 to 8 hours after intercourse.  Cervical cap. This is a round, soft, latex or plastic cup that fits over the cervix and must be fitted by a health care provider. The cap can be left in place for up to 48 hours after intercourse.  Sponge. This is a soft, circular piece of polyurethane foam. The sponge has spermicide in it. It is inserted into the vagina after wetting it and before sexual intercourse.  Spermicides. These are chemicals that kill or block sperm from entering the cervix and uterus. They come in the form of creams, jellies, suppositories, foam, or tablets. They do not require a   prescription. They are inserted into the vagina with an applicator before having sexual intercourse. The process must be repeated every time you have sexual intercourse. INTRAUTERINE CONTRACEPTION  Intrauterine device (IUD). This is a T-shaped device that is put in a woman's uterus  during a menstrual period to prevent pregnancy. There are 2 types:  Copper IUD. This type of IUD is wrapped in copper wire and is placed inside the uterus. Copper makes the uterus and fallopian tubes produce a fluid that kills sperm. It can stay in place for 10 years.  Hormone IUD. This type of IUD contains the hormone progestin (synthetic progesterone). The hormone thickens the cervical mucus and prevents sperm from entering the uterus, and it also thins the uterine lining to prevent implantation of a fertilized egg. The hormone can weaken or kill the sperm that get into the uterus. It can stay in place for 3-5 years, depending on which type of IUD is used. PERMANENT METHODS OF CONTRACEPTION  Female tubal ligation. This is when the woman's fallopian tubes are surgically sealed, tied, or blocked to prevent the egg from traveling to the uterus.  Hysteroscopic sterilization. This involves placing a small coil or insert into each fallopian tube. Your doctor uses a technique called hysteroscopy to do the procedure. The device causes scar tissue to form. This results in permanent blockage of the fallopian tubes, so the sperm cannot fertilize the egg. It takes about 3 months after the procedure for the tubes to become blocked. You must use another form of birth control for these 3 months.  Female sterilization. This is when the female has the tubes that carry sperm tied off (vasectomy).This blocks sperm from entering the vagina during sexual intercourse. After the procedure, the man can still ejaculate fluid (semen). NATURAL PLANNING METHODS  Natural family planning. This is not having sexual intercourse or using a barrier method (condom, diaphragm, cervical cap) on days the woman could become pregnant.  Calendar method. This is keeping track of the length of each menstrual cycle and identifying when you are fertile.  Ovulation method. This is avoiding sexual intercourse during ovulation.  Symptothermal  method. This is avoiding sexual intercourse during ovulation, using a thermometer and ovulation symptoms.  Post-ovulation method. This is timing sexual intercourse after you have ovulated. Regardless of which type or method of contraception you choose, it is important that you use condoms to protect against the transmission of sexually transmitted infections (STIs). Talk with your health care provider about which form of contraception is most appropriate for you.   This information is not intended to replace advice given to you by your health care provider. Make sure you discuss any questions you have with your health care provider.   Document Released: 04/07/2005 Document Revised: 04/12/2013 Document Reviewed: 09/30/2012 Elsevier Interactive Patient Education 2016 [employer]  

## 2015-03-07 ENCOUNTER — Ambulatory Visit (INDEPENDENT_AMBULATORY_CARE_PROVIDER_SITE_OTHER): Payer: 59 | Admitting: Obstetrics and Gynecology

## 2015-03-07 VITALS — BP 104/61 | HR 81 | Wt 137.8 lb

## 2015-03-07 DIAGNOSIS — Z113 Encounter for screening for infections with a predominantly sexual mode of transmission: Secondary | ICD-10-CM

## 2015-03-07 DIAGNOSIS — Z3493 Encounter for supervision of normal pregnancy, unspecified, third trimester: Secondary | ICD-10-CM

## 2015-03-07 DIAGNOSIS — O26873 Cervical shortening, third trimester: Secondary | ICD-10-CM

## 2015-03-07 LAB — POCT URINALYSIS DIPSTICK
BILIRUBIN UA: NEGATIVE
GLUCOSE UA: NEGATIVE
KETONES UA: NEGATIVE
NITRITE UA: NEGATIVE
Protein, UA: NEGATIVE
Spec Grav, UA: 1.015
Urobilinogen, UA: NEGATIVE
pH, UA: 6.5

## 2015-03-07 NOTE — Progress Notes (Signed)
ROB: Doing well.  Can d/c progesterone supplementation at 36 weeks.  Given PTL precautions.  Discussed birth plan, desires epidural, will breastfeed.  RTC in 1 week.

## 2015-03-10 LAB — GC/CHLAMYDIA PROBE AMP
Chlamydia trachomatis, NAA: NEGATIVE
NEISSERIA GONORRHOEAE BY PCR: NEGATIVE

## 2015-03-11 LAB — CULTURE, BETA STREP (GROUP B ONLY): Strep Gp B Culture: POSITIVE — AB

## 2015-03-14 ENCOUNTER — Ambulatory Visit (INDEPENDENT_AMBULATORY_CARE_PROVIDER_SITE_OTHER): Payer: 59 | Admitting: Obstetrics and Gynecology

## 2015-03-14 VITALS — BP 101/63 | HR 98 | Wt 139.3 lb

## 2015-03-14 DIAGNOSIS — Z3493 Encounter for supervision of normal pregnancy, unspecified, third trimester: Secondary | ICD-10-CM

## 2015-03-14 LAB — POCT URINALYSIS DIPSTICK
Bilirubin, UA: NEGATIVE
Glucose, UA: NEGATIVE
KETONES UA: NEGATIVE
Leukocytes, UA: NEGATIVE
Nitrite, UA: NEGATIVE
SPEC GRAV UA: 1.02
Urobilinogen, UA: NEGATIVE
pH, UA: 6.5

## 2015-03-14 NOTE — Progress Notes (Signed)
ROB: Patient denies complaints.  PTL precautions.  RTC in 1 week.

## 2015-03-20 ENCOUNTER — Ambulatory Visit (INDEPENDENT_AMBULATORY_CARE_PROVIDER_SITE_OTHER): Payer: 59 | Admitting: Obstetrics and Gynecology

## 2015-03-20 VITALS — BP 108/64 | HR 91 | Wt 140.8 lb

## 2015-03-20 DIAGNOSIS — Z3493 Encounter for supervision of normal pregnancy, unspecified, third trimester: Secondary | ICD-10-CM

## 2015-03-20 DIAGNOSIS — Z3483 Encounter for supervision of other normal pregnancy, third trimester: Secondary | ICD-10-CM

## 2015-03-20 LAB — POCT URINALYSIS DIPSTICK
BILIRUBIN UA: NEGATIVE
GLUCOSE UA: NEGATIVE
Ketones, UA: NEGATIVE
Nitrite, UA: NEGATIVE
Protein, UA: NEGATIVE
RBC UA: NEGATIVE
SPEC GRAV UA: 1.01
UROBILINOGEN UA: NEGATIVE
pH, UA: 7

## 2015-03-20 NOTE — Progress Notes (Signed)
ROB: Patient doing well, denies complaints. Reports loss of mucus plug last Friday. GBS positive, handout given on GBS in pregnancy.  Labor precautions given.  RTC in 1 week.

## 2015-03-27 ENCOUNTER — Ambulatory Visit (INDEPENDENT_AMBULATORY_CARE_PROVIDER_SITE_OTHER): Payer: 59 | Admitting: Obstetrics and Gynecology

## 2015-03-27 VITALS — BP 104/61 | HR 96 | Wt 143.0 lb

## 2015-03-27 DIAGNOSIS — O26873 Cervical shortening, third trimester: Secondary | ICD-10-CM

## 2015-03-27 DIAGNOSIS — Z3403 Encounter for supervision of normal first pregnancy, third trimester: Secondary | ICD-10-CM

## 2015-03-27 DIAGNOSIS — O360130 Maternal care for anti-D [Rh] antibodies, third trimester, not applicable or unspecified: Secondary | ICD-10-CM

## 2015-03-27 LAB — POCT URINALYSIS DIPSTICK
BILIRUBIN UA: NEGATIVE
Blood, UA: NEGATIVE
Glucose, UA: NEGATIVE
KETONES UA: NEGATIVE
LEUKOCYTES UA: NEGATIVE
NITRITE UA: NEGATIVE
PH UA: 6.5
PROTEIN UA: NEGATIVE
Spec Grav, UA: 1.01
Urobilinogen, UA: NEGATIVE

## 2015-03-27 NOTE — Progress Notes (Signed)
ROB: Patient doing well, complains of increased pressure.  Exam today essentially unchanged.  RTC in 1 week.  Labor precautions given.

## 2015-04-01 ENCOUNTER — Inpatient Hospital Stay: Payer: 59 | Admitting: Anesthesiology

## 2015-04-01 ENCOUNTER — Observation Stay
Admission: EM | Admit: 2015-04-01 | Discharge: 2015-04-01 | Disposition: A | Discharge status: Home / Self Care | Payer: 59 | Source: Home / Self Care | Admitting: Obstetrics and Gynecology

## 2015-04-01 ENCOUNTER — Inpatient Hospital Stay
Admission: EM | Admit: 2015-04-01 | Discharge: 2015-04-03 | DRG: 775 | Disposition: A | Discharge status: Home / Self Care | Payer: 59 | Attending: Obstetrics and Gynecology | Admitting: Obstetrics and Gynecology

## 2015-04-01 ENCOUNTER — Encounter: Payer: Self-pay | Admitting: *Deleted

## 2015-04-01 DIAGNOSIS — O26873 Cervical shortening, third trimester: Principal | ICD-10-CM | POA: Diagnosis present

## 2015-04-01 DIAGNOSIS — Z3A39 39 weeks gestation of pregnancy: Secondary | ICD-10-CM

## 2015-04-01 DIAGNOSIS — Z833 Family history of diabetes mellitus: Secondary | ICD-10-CM | POA: Diagnosis not present

## 2015-04-01 DIAGNOSIS — O99824 Streptococcus B carrier state complicating childbirth: Secondary | ICD-10-CM | POA: Diagnosis present

## 2015-04-01 DIAGNOSIS — Z3403 Encounter for supervision of normal first pregnancy, third trimester: Secondary | ICD-10-CM | POA: Diagnosis not present

## 2015-04-01 DIAGNOSIS — O479 False labor, unspecified: Secondary | ICD-10-CM | POA: Diagnosis present

## 2015-04-01 HISTORY — DX: Anemia, unspecified: D64.9

## 2015-04-01 HISTORY — DX: Other specified health status: Z78.9

## 2015-04-01 LAB — CBC
HEMATOCRIT: 35.9 % (ref 35.0–47.0)
Hemoglobin: 12.2 g/dL (ref 12.0–16.0)
MCH: 29.7 pg (ref 26.0–34.0)
MCHC: 33.9 g/dL (ref 32.0–36.0)
MCV: 87.7 fL (ref 80.0–100.0)
PLATELETS: 159 10*3/uL (ref 150–440)
RBC: 4.1 MIL/uL (ref 3.80–5.20)
RDW: 13.9 % (ref 11.5–14.5)
WBC: 11.8 10*3/uL — ABNORMAL HIGH (ref 3.6–11.0)

## 2015-04-01 LAB — TYPE AND SCREEN
ABO/RH(D): B NEG
ANTIBODY SCREEN: NEGATIVE

## 2015-04-01 LAB — ABO/RH: ABO/RH(D): B NEG

## 2015-04-01 MED ORDER — MISOPROSTOL 200 MCG PO TABS
ORAL_TABLET | ORAL | Status: AC
  Filled 2015-04-01: qty 4

## 2015-04-01 MED ORDER — TERBUTALINE SULFATE 1 MG/ML IJ SOLN: 0.2500 mg | Freq: Once | INTRAMUSCULAR | Status: DC | PRN

## 2015-04-01 MED ORDER — SODIUM CHLORIDE 0.9 % IV SOLN
1.0000 g | INTRAVENOUS | Status: DC
  Administered 2015-04-01 – 2015-04-02 (×2): 1 g via INTRAVENOUS
  Filled 2015-04-01 (×2): qty 1000

## 2015-04-01 MED ORDER — AMMONIA AROMATIC IN INHA
RESPIRATORY_TRACT | Status: AC
  Filled 2015-04-01: qty 10

## 2015-04-01 MED ORDER — OXYTOCIN 10 UNIT/ML IJ SOLN
INTRAMUSCULAR | Status: AC
  Filled 2015-04-01: qty 2

## 2015-04-01 MED ORDER — IBUPROFEN 600 MG PO TABS: 600.0000 mg | ORAL_TABLET | Freq: Four times a day (QID) | ORAL | Status: DC | PRN

## 2015-04-01 MED ORDER — ONDANSETRON HCL 4 MG/2ML IJ SOLN: 4.0000 mg | Freq: Four times a day (QID) | INTRAMUSCULAR | Status: DC | PRN

## 2015-04-01 MED ORDER — MEPERIDINE HCL 25 MG/ML IJ SOLN: 6.2500 mg | INTRAMUSCULAR | Status: DC | PRN

## 2015-04-01 MED ORDER — LACTATED RINGERS IV SOLN: 500.0000 mL | INTRAVENOUS | Status: DC | PRN

## 2015-04-01 MED ORDER — OXYTOCIN BOLUS FROM INFUSION: 500.0000 mL | INTRAVENOUS | Status: DC

## 2015-04-01 MED ORDER — NALBUPHINE HCL 10 MG/ML IJ SOLN: 5.0000 mg | Freq: Once | INTRAMUSCULAR | Status: DC | PRN

## 2015-04-01 MED ORDER — OXYTOCIN 40 UNITS IN LACTATED RINGERS INFUSION - SIMPLE MED
INTRAVENOUS | Status: AC
  Filled 2015-04-01: qty 1000

## 2015-04-01 MED ORDER — BUTORPHANOL TARTRATE 1 MG/ML IJ SOLN
1.0000 mg | Freq: Once | INTRAMUSCULAR | Status: AC
  Administered 2015-04-01 (×2): 1 mg via INTRAVENOUS

## 2015-04-01 MED ORDER — OXYTOCIN 40 UNITS IN LACTATED RINGERS INFUSION - SIMPLE MED
62.5000 mL/h | INTRAVENOUS | Status: DC
  Filled 2015-04-01: qty 1000

## 2015-04-01 MED ORDER — LIDOCAINE-EPINEPHRINE (PF) 1.5 %-1:200000 IJ SOLN
INTRAMUSCULAR | Status: DC | PRN
  Administered 2015-04-01: 3 mL via PERINEURAL

## 2015-04-01 MED ORDER — NALOXONE HCL 0.4 MG/ML IJ SOLN: 0.4000 mg | INTRAMUSCULAR | Status: DC | PRN

## 2015-04-01 MED ORDER — BUTORPHANOL TARTRATE 1 MG/ML IJ SOLN
INTRAMUSCULAR | Status: AC
  Administered 2015-04-01: 1 mg via INTRAVENOUS
  Filled 2015-04-01: qty 1

## 2015-04-01 MED ORDER — FENTANYL 2.5 MCG/ML W/ROPIVACAINE 0.2% IN NS 100 ML EPIDURAL INFUSION (ARMC-ANES)
EPIDURAL | Status: AC
  Administered 2015-04-01: 10 mL/h via EPIDURAL
  Filled 2015-04-01: qty 100

## 2015-04-01 MED ORDER — SCOPOLAMINE 1 MG/3DAYS TD PT72
1.0000 | MEDICATED_PATCH | Freq: Once | TRANSDERMAL | Status: DC
  Filled 2015-04-01: qty 1

## 2015-04-01 MED ORDER — SODIUM CHLORIDE 0.9 % IJ SOLN: 3.0000 mL | INTRAMUSCULAR | Status: DC | PRN

## 2015-04-01 MED ORDER — DIPHENHYDRAMINE HCL 50 MG/ML IJ SOLN: 12.5000 mg | INTRAMUSCULAR | Status: DC | PRN

## 2015-04-01 MED ORDER — LIDOCAINE HCL (PF) 1 % IJ SOLN
INTRAMUSCULAR | Status: AC
  Filled 2015-04-01: qty 30

## 2015-04-01 MED ORDER — FENTANYL 2.5 MCG/ML W/ROPIVACAINE 0.2% IN NS 100 ML EPIDURAL INFUSION (ARMC-ANES)
10.0000 mL/h | EPIDURAL | Status: DC
  Filled 2015-04-01: qty 100

## 2015-04-01 MED ORDER — NALBUPHINE HCL 10 MG/ML IJ SOLN: 5.0000 mg | INTRAMUSCULAR | Status: DC | PRN

## 2015-04-01 MED ORDER — SODIUM CHLORIDE 0.9 % IV SOLN
2.0000 g | Freq: Once | INTRAVENOUS | Status: AC
  Administered 2015-04-01: 2 g via INTRAVENOUS

## 2015-04-01 MED ORDER — OXYTOCIN 40 UNITS IN LACTATED RINGERS INFUSION - SIMPLE MED
1.0000 m[IU]/min | INTRAVENOUS | Status: DC
  Administered 2015-04-01: 1 m[IU]/min via INTRAVENOUS

## 2015-04-01 MED ORDER — NALOXONE HCL 2 MG/2ML IJ SOSY
1.0000 ug/kg/h | PREFILLED_SYRINGE | INTRAVENOUS | Status: DC | PRN
  Filled 2015-04-01: qty 2

## 2015-04-01 MED ORDER — LACTATED RINGERS IV SOLN
INTRAVENOUS | Status: DC
  Administered 2015-04-01 (×2): via INTRAVENOUS

## 2015-04-01 MED ORDER — DIPHENHYDRAMINE HCL 25 MG PO CAPS: 25.0000 mg | ORAL_CAPSULE | ORAL | Status: DC | PRN

## 2015-04-01 MED ORDER — ONDANSETRON HCL 4 MG/2ML IJ SOLN: 4.0000 mg | Freq: Three times a day (TID) | INTRAMUSCULAR | Status: DC | PRN

## 2015-04-01 MED ORDER — SODIUM CHLORIDE 0.9 % IV SOLN
INTRAVENOUS | Status: AC
  Administered 2015-04-01: 2 g via INTRAVENOUS
  Filled 2015-04-01: qty 2000

## 2015-04-01 MED ORDER — LIDOCAINE HCL (PF) 1 % IJ SOLN: 30.0000 mL | INTRAMUSCULAR | Status: DC | PRN

## 2015-04-01 NOTE — Progress Notes (Signed)
Intrapartum Progress Note  S: Patient doing well, now s/p epidural.   O: Blood pressure 122/69, pulse 88, temperature 97.8 F (36.6 C), temperature source Oral, resp. rate 16, SpO2 98 %. Gen App: NAD, comfortable Abdomen: soft, gravid FHT: baseline 125 bpm.  Accels present.  Decels absent. moderate in degree variability.   Tocometer: contractions q 5-10 minutes Cervix: 5.5/100/0/c/intact Extremities: Nontender, no edema.   Labs:  No new labs    Assessment:  1: SIUP at [redacted]w[redacted]d 2. Latent labor 3. GBS positive  Plan:  1. Pitocin for augmentation as needed 2. Ampicillin for GBS prophylaxis.    [name], MD 04/01/2015 8:41 PM

## 2015-04-01 NOTE — Anesthesia Preprocedure Evaluation (Signed)
Anesthesia Evaluation  Patient identified by MRN, date of birth, ID band Patient awake    Reviewed: Allergy & Precautions, Patient's Chart, lab work & pertinent test results  History of Anesthesia Complications Negative for: history of anesthetic complications  Airway Mallampati: II       Dental   Pulmonary neg pulmonary ROS,           Cardiovascular negative cardio ROS       Neuro/Psych negative neurological ROS     GI/Hepatic negative GI ROS, Neg liver ROS,   Endo/Other  negative endocrine ROS  Renal/GU negative Renal ROS     Musculoskeletal   Abdominal   Peds  Hematology  (+) anemia ,   Anesthesia Other Findings   Reproductive/Obstetrics (+) Pregnancy                             Anesthesia Physical Anesthesia Plan  ASA: II  Anesthesia Plan: Epidural   Post-op Pain Management:    Induction:   Airway Management Planned:   Additional Equipment:   Intra-op Plan:   Post-operative Plan:   Informed Consent: I have reviewed the patients History and Physical, chart, labs and discussed the procedure including the risks, benefits and alternatives for the proposed anesthesia with the patient or authorized representative who has indicated his/her understanding and acceptance.     Plan Discussed with:   Anesthesia Plan Comments:         Anesthesia Quick Evaluation

## 2015-04-01 NOTE — OB Triage Note (Signed)
Contractions since 0400 thia am. Mercedes Patel

## 2015-04-01 NOTE — H&P (Signed)
Obstetric History and Physical  Mercedes Patel is a 24 y.o. G1P0 with IUP at 109w2d presenting for contractions. Patient was seen earlier today with contractions, noted to be in latent labor.  She notes states she has been having worsening   regular, every 5 minutes contractions, none vaginal bleeding, intact membranes, with active fetal movement.    Prenatal Course Source of Care: [facility] with onset of care at 10 weeks Pregnancy complications or risks: Patient Active Problem List   Diagnosis Date Noted  . Irregular uterine contractions 04/01/2015  . Indication for care in labor or delivery 04/01/2015  . Supervision of normal pregnancy in third trimester 01/25/2015  . Poor weight gain of pregnancy 01/25/2015  . Rh negative, maternal 01/25/2015  . Short cervical length during pregnancy 11/08/2014  . Costochondritis 02/05/2011  . ANA positive 07/15/2010  . MGUS (monoclonal gammopathy of unknown significance) 06/26/2010   She plans to breastfeed She desires oral contraceptives (estrogen/progesterone) for postpartum contraception.   Prenatal labs and studies: ABO, Rh: --/--/PENDING (12/11 1830) Antibody: PENDING (12/11 1830) Rubella: Immune (05/04 0000) RPR: Nonreactive (05/04 0000)  HBsAg: Negative (05/04 0000)  HIV: Non-reactive (05/04 0000)  GBS:  1 hr Glucola  normal Genetic screening normal Anatomy US normal  Prenatal Transfer Tool  Maternal Diabetes: No Genetic Screening: Normal Maternal Ultrasounds/Referrals: Abnormal:  Findings:   Other:shortened cervical length Fetal Ultrasounds or other Referrals:  None Maternal Substance Abuse:  No Significant Maternal Medications:  Meds include: Progesterone Significant Maternal Lab Results: Lab values include: Group B Strep positive, Rh negative  Past Medical History  Diagnosis Date  . Medical history non-contributory   . Anemia     Past Surgical History  Procedure Laterality Date  . No past surgeries       OB History  Gravida Para Term Preterm AB SAB TAB Ectopic Multiple Living  1 0 0 0 0 0 0 0 0 0     # Outcome Date GA Lbr Len/2nd Weight Sex Delivery Anes PTL Lv  1 Current               Social History   Social History  . Marital Status: Single    Spouse Name: N/A  . Number of Children: N/A  . Years of Education: N/A   Social History Main Topics  . Smoking status: Never Smoker   . Smokeless tobacco: Never Used  . Alcohol Use: No  . Drug Use: No  . Sexual Activity: Yes    Birth Control/ Protection: None     Comment: Pregnant   Other Topics Concern  . None   Social History Narrative    Family History  Problem Relation Age of Onset  . Diabetes Paternal Grandmother   . Cancer Neg Hx   . Heart disease Neg Hx     Prescriptions prior to admission  Medication Sig Dispense Refill Last Dose  . Misc. Devices (BREAST PUMP) MISC 1 Device by Does not apply route as needed. 1 each 0 Taking  . Prenatal Vit-Fe Fumarate-FA (MULTIVITAMIN-PRENATAL) 27-0.8 MG TABS tablet Take 1 tablet by mouth daily at 12 noon.   03/31/2015 at Unknown time  . progesterone 200 MG SUPP Place 1 suppository (200 mg total) vaginally at bedtime. (Patient not taking: Reported on 04/01/2015) 30 each 4 Completed Course at Unknown time    No Known Allergies  Review of Systems: Negative except for what is mentioned in HPI.  Physical Exam: BP 133/77 mmHg  Pulse 105  Temp(Src) 98.7 F (  37.1 C) (Oral)  Resp 16  SpO2 97% CONSTITUTIONAL: Well-developed, well-nourished female in no acute distress.  HENT:  Normocephalic, atraumatic, External right and left ear normal. Oropharynx is clear and moist EYES: Conjunctivae and EOM are normal. Pupils are equal, round, and reactive to light. No scleral icterus.  NECK: Normal range of motion, supple, no masses SKIN: Skin is warm and dry. No rash noted. Not diaphoretic. No erythema. No pallor. [place]: Alert and oriented to person, place, and time. Normal reflexes,  muscle tone coordination. No cranial nerve deficit noted. PSYCHIATRIC: Normal mood and affect. Normal behavior. Normal judgment and thought content. CARDIOVASCULAR: Normal heart rate noted, regular rhythm RESPIRATORY: Effort and breath sounds normal, no problems with respiration noted ABDOMEN: Soft, nontender, nondistended, gravid. MUSCULOSKELETAL: Normal range of motion. No edema and no tenderness. 2+ distal pulses.  Cervical Exam: Dilatation  5.5 cm   Effacement 100%   Station 0   Presentation: cephalic FHT:  Baseline rate 125 bpm   Variability moderate  Accelerations present   Decelerations none Contractions: Every 4-5 mins   Pertinent Labs/Studies:   Results for orders placed or performed during the hospital encounter of 04/01/15 (from the past 24 hour(s))  Type and screen     Status: None (Preliminary result)   Collection Time: 04/01/15  6:30 PM  Result Value Ref Range   ABO/RH(D) PENDING    Antibody Screen PENDING    Sample Expiration 04/04/2015   CBC     Status: Abnormal   Collection Time: 04/01/15  6:36 PM  Result Value Ref Range   WBC 11.8 (H) 3.6 - 11.0 K/uL   RBC 4.10 3.80 - 5.20 MIL/uL   Hemoglobin 12.2 12.0 - 16.0 g/dL   HCT 35.9 35.0 - 47.0 %   MCV 87.7 80.0 - 100.0 fL   MCH 29.7 26.0 - 34.0 pg   MCHC 33.9 32.0 - 36.0 g/dL   RDW 13.9 11.5 - 14.5 %   Platelets 159 150 - 440 K/uL    Assessment : Mercedes Patel is a 24 y.o. G1P0000 at [redacted]w[redacted]d being admitted for labor.  Plan: Labor: Expectant management.  Augmentation as needed, per protocol FWB: Reassuring fetal heart tracing.  GBS positive, to treat with ampicillin.  Rh negative.  WIll assess for need for Rhogam postpartum.  Delivery plan: Hopeful for vaginal delivery   [name], MD [facility]

## 2015-04-01 NOTE — Anesthesia Procedure Notes (Signed)
Epidural Patient location during procedure: OB Start time: 04/01/2015 7:15 PM End time: 04/01/2015 7:39 PM  Staffing Performed by: anesthesiologist   Preanesthetic Checklist Completed: patient identified, site marked, surgical consent, pre-op evaluation, timeout performed, IV checked, risks and benefits discussed and monitors and equipment checked  Epidural Patient position: sitting Prep: Betadine Patient monitoring: heart rate, continuous pulse ox and blood pressure Approach: midline Location: L4-L5 Injection technique: LOR saline  Needle:  Needle type: Tuohy  Needle gauge: 18 G Needle length: 9 cm and 9 Needle insertion depth: 7 cm Catheter type: closed end flexible Catheter size: 20 Guage Catheter at skin depth: 9 cm Test dose: negative and 1.5% lidocaine with Epi 1:200 K  Assessment Events: blood not aspirated, injection not painful, no injection resistance, negative IV test and no paresthesia  Additional Notes   Patient tolerated the insertion well without complications.Reason for block:procedure for pain

## 2015-04-01 NOTE — Final Progress Note (Signed)
L&D OB Triage Note  HPI:  Mercedes Patel is a 24 y.o. G1P0000 female at [redacted]w[redacted]d. Estimated Date of Delivery: 04/06/15 who presents for complaints of contractions since 0400 this morning.  Notes contractions ~ 5-7 min apart.  Denies LOF, vaginal bleeding, decreased FM.    ROS:  Review of Systems - Negative except contractions   Physical Exam:  Blood pressure 129/74, pulse 94, temperature 97.7 F (36.5 C), temperature source Oral, resp. rate 18, height 5\' 4"  (1.626 m), weight 143 lb (64.864 kg). General appearance: alert and no distress Abdomen: soft, non-tender; bowel sounds normal; no masses,  no organomegaly and gravid Pelvic: external genitalia normal and cervix 4/90/-1/intact Extremities: extremities normal, atraumatic, no cyanosis or edema   FETAL SURVEILLANCE TESTING SUMMARY  NST INTERPRETATION: Indications: rule out uterine contractions  Mode: External (reapplied, assessing ) Baseline Rate (A): 140 bpm Variability: Moderate Accelerations: 15 x 15 Decelerations: None     Contraction Frequency (min): 2.5-5.5  Impression: reactive   Assessment:  24 y.o. G1P0000 at [redacted]w[redacted]d with:  1. Latent labor  Plan:  1. NST performed was reviewed and was found to be reactive. She was discharged home with bleeding/labor precautions.  Membrane stripping performed. Continue routine prenatal care. Follow up with OB/GYN as previously scheduled.    [name], MD

## 2015-04-02 DIAGNOSIS — Z3403 Encounter for supervision of normal first pregnancy, third trimester: Secondary | ICD-10-CM

## 2015-04-02 MED ORDER — ONDANSETRON HCL 4 MG PO TABS: 4.0000 mg | ORAL_TABLET | ORAL | Status: DC | PRN

## 2015-04-02 MED ORDER — DIPHENHYDRAMINE HCL 25 MG PO CAPS: 25.0000 mg | ORAL_CAPSULE | Freq: Four times a day (QID) | ORAL | Status: DC | PRN

## 2015-04-02 MED ORDER — ONDANSETRON HCL 4 MG/2ML IJ SOLN: 4.0000 mg | INTRAMUSCULAR | Status: DC | PRN

## 2015-04-02 MED ORDER — IBUPROFEN 800 MG PO TABS
800.0000 mg | ORAL_TABLET | Freq: Four times a day (QID) | ORAL | Status: DC
  Administered 2015-04-02: 800 mg via ORAL
  Filled 2015-04-02 (×2): qty 1

## 2015-04-02 MED ORDER — PRENATAL MULTIVITAMIN CH
1.0000 | ORAL_TABLET | Freq: Every day | ORAL | Status: DC
  Administered 2015-04-02 – 2015-04-03 (×2): 1 via ORAL
  Filled 2015-04-02 (×2): qty 1

## 2015-04-02 MED ORDER — WITCH HAZEL-GLYCERIN EX PADS: 1.0000 "application " | MEDICATED_PAD | CUTANEOUS | Status: DC | PRN

## 2015-04-02 MED ORDER — OXYCODONE-ACETAMINOPHEN 5-325 MG PO TABS: 1.0000 | ORAL_TABLET | ORAL | Status: DC | PRN

## 2015-04-02 MED ORDER — LANOLIN HYDROUS EX OINT: TOPICAL_OINTMENT | CUTANEOUS | Status: DC | PRN

## 2015-04-02 MED ORDER — DIBUCAINE 1 % RE OINT: 1.0000 "application " | TOPICAL_OINTMENT | RECTAL | Status: DC | PRN

## 2015-04-02 MED ORDER — ACETAMINOPHEN 325 MG PO TABS: 650.0000 mg | ORAL_TABLET | ORAL | Status: DC | PRN

## 2015-04-02 MED ORDER — SIMETHICONE 80 MG PO CHEW: 80.0000 mg | CHEWABLE_TABLET | ORAL | Status: DC | PRN

## 2015-04-02 MED ORDER — IBUPROFEN 800 MG PO TABS
800.0000 mg | ORAL_TABLET | Freq: Four times a day (QID) | ORAL | Status: DC
  Administered 2015-04-03 (×3): 800 mg via ORAL
  Filled 2015-04-02 (×3): qty 1

## 2015-04-02 MED ORDER — ZOLPIDEM TARTRATE 5 MG PO TABS: 5.0000 mg | ORAL_TABLET | Freq: Every evening | ORAL | Status: DC | PRN

## 2015-04-02 MED ORDER — BENZOCAINE-MENTHOL 20-0.5 % EX AERO: 1.0000 "application " | INHALATION_SPRAY | CUTANEOUS | Status: DC | PRN

## 2015-04-02 MED ORDER — SENNOSIDES-DOCUSATE SODIUM 8.6-50 MG PO TABS
2.0000 | ORAL_TABLET | ORAL | Status: DC
  Administered 2015-04-03: 2 via ORAL
  Filled 2015-04-02: qty 2

## 2015-04-02 MED ORDER — IBUPROFEN 800 MG PO TABS
800.0000 mg | ORAL_TABLET | Freq: Four times a day (QID) | ORAL | Status: DC
  Administered 2015-04-02: 800 mg via ORAL
  Filled 2015-04-02: qty 1

## 2015-04-02 NOTE — Plan of Care (Signed)
Pt up to void. Voided QS.  Pericare demonstrated and done well per pt. Ready for transfer to [facility] via wheelchair. Family in room bonding well with infant. Lenore Cordia RNC

## 2015-04-03 ENCOUNTER — Encounter: Payer: Self-pay | Admitting: Obstetrics and Gynecology

## 2015-04-03 LAB — CBC
HCT: 33.4 % — ABNORMAL LOW (ref 35.0–47.0)
Hemoglobin: 11.4 g/dL — ABNORMAL LOW (ref 12.0–16.0)
MCH: 30.4 pg (ref 26.0–34.0)
MCHC: 34 g/dL (ref 32.0–36.0)
MCV: 89.4 fL (ref 80.0–100.0)
PLATELETS: 169 10*3/uL (ref 150–440)
RBC: 3.73 MIL/uL — ABNORMAL LOW (ref 3.80–5.20)
RDW: 14.2 % (ref 11.5–14.5)
WBC: 8.3 10*3/uL (ref 3.6–11.0)

## 2015-04-03 LAB — RPR: RPR Ser Ql: NONREACTIVE

## 2015-04-03 LAB — FETAL SCREEN: FETAL SCREEN: NEGATIVE

## 2015-04-03 MED ORDER — RHO D IMMUNE GLOBULIN 1500 UNIT/2ML IJ SOSY
300.0000 ug | PREFILLED_SYRINGE | Freq: Once | INTRAMUSCULAR | Status: AC
  Administered 2015-04-03: 300 ug via INTRAMUSCULAR
  Filled 2015-04-03: qty 2

## 2015-04-03 MED ORDER — IBUPROFEN 800 MG PO TABS: 800.0000 mg | ORAL_TABLET | Freq: Three times a day (TID) | ORAL | Status: DC | PRN

## 2015-04-03 NOTE — Discharge Instructions (Signed)

## 2015-04-03 NOTE — Progress Notes (Addendum)
Post Partum Day #1 s/p SVD  Subjective: no complaints, up ad lib, voiding and tolerating PO  Objective: Blood pressure 117/71, pulse 88, temperature 98.1 F (36.7 C), temperature source Oral, resp. rate 18, height 5\' 4"  (1.626 m), weight 143 lb (64.864 kg), SpO2 100 %, unknown if currently breastfeeding.  Physical Exam:  General: alert and no distress Lochia: appropriate Uterine Fundus: firm Incision: None DVT Evaluation: No evidence of DVT seen on physical exam. Negative Homan's sign. No cords or calf tenderness. No significant calf/ankle edema.   Recent Labs  04/01/15 1836 04/03/15 0630  HGB 12.2 11.4*  HCT 35.9 33.4*    Assessment/Plan: Breastfeeding and Contraception OCPs (combined) Rhogam postpartum as indicated for maternal Rh negative status.  Patient can possibly d/c home later today if desired and if newborn cleared, otherwise, will d/c home tomorrow.    LOS: 2 days   [name] 04/03/2015, 9:18 AM

## 2015-04-03 NOTE — Anesthesia Postprocedure Evaluation (Signed)
Anesthesia Post Note  Patient: Mercedes Patel  Procedure(s) Performed: * No procedures listed *  Patient location during evaluation: Mother Baby Anesthesia Type: Epidural Level of consciousness: awake and alert and oriented Pain management: pain level controlled Vital Signs Assessment: vitals unstable Respiratory status: spontaneous breathing Cardiovascular status: stable Postop Assessment: no headache and no backache Anesthetic complications: no    Last Vitals:  Filed Vitals:   04/03/15 0422 04/03/15 0805  BP: 111/60 117/71  Pulse: 81 88  Temp: 36.8 C 36.7 C  Resp:  18    Last Pain:  Filed Vitals:   04/03/15 0846  PainSc: 0-No pain                 Johnna Acosta

## 2015-04-03 NOTE — Discharge Summary (Signed)
Obstetric Discharge Summary Reason for Admission: onset of labor Prenatal Procedures: ultrasound for growth and f/u shortened cervix Intrapartum Procedures: spontaneous vaginal delivery Postpartum Procedures: Rho(D) Ig Complications-Operative and Postpartum: 2nd degree perineal laceration and 1st degree supraurethral laceration HEMOGLOBIN  Date Value Ref Range Status  04/03/2015 11.4* 12.0 - 16.0 g/dL Final  08/23/2014 13.0 g/dL Final   HGB  Date Value Ref Range Status  03/12/2011 12.4 11.6 - 15.9 g/dL Final   HCT  Date Value Ref Range Status  04/03/2015 33.4* 35.0 - 47.0 % Final  08/23/2014 37 % Final  03/12/2011 37.2 34.8 - 46.6 % Final   HEMATOCRIT  Date Value Ref Range Status  01/16/2015 32.6* 34.0 - 46.6 % Final    Physical Exam:  General: alert and no distress Lochia: appropriate Uterine Fundus: firm Incision: None DVT Evaluation: No evidence of DVT seen on physical exam. Negative Homan's sign. No cords or calf tenderness. No significant calf/ankle edema.  Discharge Diagnoses: Term Pregnancy-delivered  Discharge Information: Date: 04/03/2015 Activity: pelvic rest Diet: routine Medications: PNV and Ibuprofen Condition: stable Instructions: refer to practice specific booklet Discharge to: home Follow-up Information    Follow up with [name], MD In 6 weeks.   Specialties:  Obstetrics and Gynecology, Radiology   Why:  Postpartum visit   Contact information:   [place] [id number] [phone]       Newborn Data: Live born female  Birth Weight: 5 lb 13.3 oz (2645 g) APGAR: 7, 8  Home with mother.  [name] 04/03/2015, 12:27 PM

## 2015-04-03 NOTE — Progress Notes (Signed)
Pt discharged home with infant.  Discharge instructions and follow up appointment given to and reviewed with pt.  Pt verbalized understanding.  Escorted by auxillary. 

## 2015-04-04 ENCOUNTER — Encounter: Payer: 59 | Admitting: Obstetrics and Gynecology

## 2015-04-04 LAB — RHOGAM INJECTION: UNIT DIVISION: 0

## 2015-04-09 ENCOUNTER — Inpatient Hospital Stay
Admission: RE | Admit: 2015-04-09 | Discharge: 2015-04-09 | Disposition: A | Discharge status: Home / Self Care | Payer: 59 | Source: Ambulatory Visit

## 2015-04-09 NOTE — Lactation Note (Signed)
Lactation Consultation Note  Patient Name: Mercedes Patel Today's Date: 04/09/2015     Maternal Data  Mom's birthdate is 1990-04-30.  Mom came in 04/08/2015 with FOB, Berdie Ogren, for out patient lactation consult.  Baby, Garwin Brothers, had lost more than 9% of birth weight when went for Pediatrician visit 09/04/2014.  Pediatrician had mom supplementing with a bottle of formula (2 oz), but baby was not taking 2 oz and was spitting a lot of what was taking in from the bottle.  Mom's mature milk has transitioned in now and is pumping more than 2 oz after breast feeding.  Breasts are full today causing difficulty latch.  Baby is used to bottle nipple and fast flow bottle and on and off the breast.  Nipple shield, #20, placed and baby latched to NS with minimal difficulty.  She was able to sustain latch and continued to breast feed on both sides even though she had just breast fed from one side before coming to consult.  She took in 42 ml from breast at this feeding per pre and post feeding weight.  Mom's breasts were softer after breast feeding.  Breelyn had gained 11.2 oz in 2 days according to baby weigh scale in lactation office.  Breelyn has 3 to 4 yellow seedy stools and 8 plus clear voids in last 24 hours.  Encouraged mom to breast feed every 2 to 3 hours and supplement if poor or no breast feed pumping and supplementing with expressed breast milk.  Mom has appointment with Pediatrician tomorrow 04/09/2015.  Plan of care can be altered if she has lost significantly at Pediatric follow up.  Mom to call for follow up consult with LC if further weight loss, decreased voids or stools, or with any concerns or problems.    Feeding    LATCH Score/Interventions                      Lactation Tools Discussed/Used     Consult Status      Jarold Motto 04/09/2015, 9:05 PM

## 2015-05-17 ENCOUNTER — Encounter: Payer: Self-pay | Admitting: Obstetrics and Gynecology

## 2015-05-17 ENCOUNTER — Ambulatory Visit (INDEPENDENT_AMBULATORY_CARE_PROVIDER_SITE_OTHER): Payer: 59 | Admitting: Obstetrics and Gynecology

## 2015-05-17 DIAGNOSIS — Z3009 Encounter for other general counseling and advice on contraception: Secondary | ICD-10-CM

## 2015-05-17 NOTE — Progress Notes (Signed)
   OBSTETRIC POSTPARTUM CLINIC VISIT  Subjective:     Mercedes Patel is a 25 y.o. G65P1001 female who presents for a postpartum visit. She is 6 weeks postpartum following a spontaneous vaginal delivery. Pregnancy complicated by shortened cervix. I have fully reviewed the prenatal and intrapartum course. The delivery was at 74 gestational weeks. Anesthesia: epidural. Postpartum course has been well. Baby's course has been well. Baby is feeding by breast. Bleeding no bleeding. Bowel function is normal. Bladder function is normal. Patient is not sexually active. Contraception method is Nexplanon. Postpartum depression screening: negative.  The following portions of the patient's history were reviewed and updated as appropriate: allergies, current medications, past family history, past medical history, past social history, past surgical history and problem list.  Review of Systems Pertinent items noted in HPI and remainder of comprehensive ROS otherwise negative.   Objective:    BP 139/72 mmHg  Pulse 80  Ht 5\' 4"  (1.626 m)  Wt 120 lb 14.4 oz (54.84 kg)  BMI 20.74 kg/m2  Breastfeeding? Yes  General:  cooperative and no distress   Breasts:  inspection negative, no nipple discharge or bleeding, no masses or nodularity palpable  Lungs: clear to auscultation bilaterally  Heart:  regular rate and rhythm, S1, S2 normal, no murmur, click, rub or gallop  Abdomen: soft, non-tender; bowel sounds normal; no masses,  no organomegaly   Vulva:  normal  Vagina: normal vagina, no discharge, exudate, lesion, or erythema  Cervix:  multiparous appearance, no cervical motion tenderness and no lesions  Corpus: normal size, contour, position, consistency, mobility, non-tender  Adnexa:  normal adnexa  Rectal Exam: Not performed.         Lab Results  Component Value Date   HGB 11.4* 04/03/2015    Assessment:   Routine postpartum exam. Pap smear not done at today's visit.   General counseling on  contraception  Plan:    1. Contraception: Nexplanon desired.  Counseled on risks/benefits. Will place in 2 weeks.  2. Follow up in: 3-6 months for annual exam.  Desires to transition GYN care to [facility].  Will get records from [facility].      [name], MD [facility]

## 2015-05-18 ENCOUNTER — Ambulatory Visit (INDEPENDENT_AMBULATORY_CARE_PROVIDER_SITE_OTHER): Payer: 59 | Admitting: Family Medicine

## 2015-05-18 ENCOUNTER — Telehealth: Payer: Self-pay | Admitting: Family Medicine

## 2015-05-18 ENCOUNTER — Encounter: Payer: Self-pay | Admitting: Obstetrics and Gynecology

## 2015-05-18 ENCOUNTER — Encounter: Payer: Self-pay | Admitting: Family Medicine

## 2015-05-18 VITALS — BP 114/65 | HR 70 | Temp 98.0°F | Wt 120.0 lb

## 2015-05-18 DIAGNOSIS — Z3040 Encounter for surveillance of contraceptives, unspecified: Secondary | ICD-10-CM | POA: Diagnosis not present

## 2015-05-18 DIAGNOSIS — Z30017 Encounter for initial prescription of implantable subdermal contraceptive: Secondary | ICD-10-CM | POA: Insufficient documentation

## 2015-05-18 DIAGNOSIS — Z30019 Encounter for initial prescription of contraceptives, unspecified: Secondary | ICD-10-CM | POA: Diagnosis not present

## 2015-05-18 DIAGNOSIS — Z308 Encounter for other contraceptive management: Secondary | ICD-10-CM

## 2015-05-18 LAB — POCT URINE PREGNANCY: PREG TEST UR: NEGATIVE

## 2015-05-18 MED ORDER — ETONOGESTREL 68 MG ~~LOC~~ IMPL
68.0000 mg | DRUG_IMPLANT | Freq: Once | SUBCUTANEOUS | Status: AC
  Administered 2015-05-18: 68 mg via SUBCUTANEOUS

## 2015-05-18 NOTE — Progress Notes (Signed)
Patient ID: [name], female   DOB: 1990-08-02, 25 y.o.   MRN: [id number] CLINIC PROCEDURE NOTE  Subjective Mercedes Patel is a 25 y.o. G1P1001 here for Nexplanon insertion. She has read multiple things about various contraceptive methods. She was able to see and feel what the Nexplanon would feel like after inserted.  Last pap smear was on 03/16/2013 and revealed LSIL/HPV. She is postpartum and has not had sex since prior to delivery. Non-smoker. No history of cancer. No history of PE or DVT.  No other gynecologic concerns.  Objective  Blood pressure 114/65, pulse 70, temperature 98 F (36.7 C), temperature source Oral, weight 120 lb (54.432 kg), currently breastfeeding. Sitting up in NAD, pleasant.   Nexplanon Insertion Procedure Patient identified, informed consent performed, consent signed.   Patient does understand that irregular bleeding is a very common side effect of this medication. She was advised to have backup contraception for one week after placement. Pregnancy test in clinic today was negative.  Appropriate time out taken.  Patient's left arm was prepped and draped in the usual sterile fashion.. The ruler used to measure and mark insertion area.  Patient was prepped with alcohol swab and then injected with 3 ml of 1% lidocaine with epinephrine.  She was prepped with betadine, Nexplanon removed from packaging,  Device confirmed in needle, then inserted full length of needle and withdrawn per handbook instructions. Nexplanon was able to palpated in the patient's arm; patient palpated the insert herself. There was minimal blood loss.  Patient insertion site covered with guaze and a pressure bandage to reduce any bruising.  The patient tolerated the procedure well and was given post procedure instructions.   Lot #: [id number] Expiration date: 06/2017  A&P: 25 y/o presenting for desired contraception with Elberta placed today without complications.  - back up  contraception x 1 week discussed.   Archie Patten, MD [facility] Resident  05/18/2015, 3:45 PM

## 2015-05-18 NOTE — Telephone Encounter (Signed)
Mother called, set appt for 2:15 with Dr. Lorenso Courier. Ottis Stain, CMA

## 2015-05-18 NOTE — Telephone Encounter (Signed)
Spoke to Mother. Gave her options of Dr. Jerline Pain this am, Lorenso Courier this pm or [place] this pm. She will call back in just a couple of minutes. Ottis Stain, CMA

## 2015-05-18 NOTE — Telephone Encounter (Signed)
Mother was insistant about having this done TODAY!

## 2015-05-18 NOTE — Patient Instructions (Addendum)
Keep the area wrapped for 24 hours. You may have some bruising around the site of injection  Use an alternative form of contraception for the next 7 days If you note increased pain, redness with warmth, or drainage at the site of placement please come back in to see [place].   Etonogestrel implant What is this medicine? ETONOGESTREL (et oh noe JES trel) is a contraceptive (birth control) device. It is used to prevent pregnancy. It can be used for up to 3 years. This medicine may be used for other purposes; ask your health care provider or pharmacist if you have questions. What should I tell my health care provider before I take this medicine? They need to know if you have any of these conditions: -abnormal vaginal bleeding -blood vessel disease or blood clots -cancer of the breast, cervix, or liver -depression -diabetes -gallbladder disease -headaches -heart disease or recent heart attack -high blood pressure -high cholesterol -kidney disease -liver disease -renal disease -seizures -tobacco smoker -an unusual or allergic reaction to etonogestrel, other hormones, anesthetics or antiseptics, medicines, foods, dyes, or preservatives -pregnant or trying to get pregnant -breast-feeding How should I use this medicine? This device is inserted just under the skin on the inner side of your upper arm by a health care professional. Talk to your pediatrician regarding the use of this medicine in children. Special care may be needed. Overdosage: If you think you have taken too much of this medicine contact a poison control center or emergency room at once. NOTE: This medicine is only for you. Do not share this medicine with others. What if I miss a dose? This does not apply. What may interact with this medicine? Do not take this medicine with any of the following medications: -amprenavir -bosentan -fosamprenavir This medicine may also interact with the following medications: -barbiturate  medicines for inducing sleep or treating seizures -certain medicines for fungal infections like ketoconazole and itraconazole -griseofulvin -medicines to treat seizures like carbamazepine, felbamate, oxcarbazepine, phenytoin, topiramate -modafinil -phenylbutazone -rifampin -some medicines to treat HIV infection like atazanavir, indinavir, lopinavir, nelfinavir, tipranavir, ritonavir -St. John's wort This list may not describe all possible interactions. Give your health care provider a list of all the medicines, herbs, non-prescription drugs, or dietary supplements you use. Also tell them if you smoke, drink alcohol, or use illegal drugs. Some items may interact with your medicine. What should I watch for while using this medicine? This product does not protect you against HIV infection (AIDS) or other sexually transmitted diseases. You should be able to feel the implant by pressing your fingertips over the skin where it was inserted. Contact your doctor if you cannot feel the implant, and use a non-hormonal birth control method (such as condoms) until your doctor confirms that the implant is in place. If you feel that the implant may have broken or become bent while in your arm, contact your healthcare provider. What side effects may I notice from receiving this medicine? Side effects that you should report to your doctor or health care professional as soon as possible: -allergic reactions like skin rash, itching or hives, swelling of the face, lips, or tongue -breast lumps -changes in emotions or moods -depressed mood -heavy or prolonged menstrual bleeding -pain, irritation, swelling, or bruising at the insertion site -scar at site of insertion -signs of infection at the insertion site such as fever, and skin redness, pain or discharge -signs of pregnancy -signs and symptoms of a blood clot such as breathing problems; changes  in vision; chest pain; severe, sudden headache; pain, swelling,  warmth in the leg; trouble speaking; sudden numbness or weakness of the face, arm or leg -signs and symptoms of liver injury like dark yellow or brown urine; general ill feeling or flu-like symptoms; light-colored stools; loss of appetite; nausea; right upper belly pain; unusually weak or tired; yellowing of the eyes or skin -unusual vaginal bleeding, discharge -signs and symptoms of a stroke like changes in vision; confusion; trouble speaking or understanding; severe headaches; sudden numbness or weakness of the face, arm or leg; trouble walking; dizziness; loss of balance or coordination Side effects that usually do not require medical attention (Report these to your doctor or health care professional if they continue or are bothersome.): -acne -back pain -breast pain -changes in weight -dizziness -general ill feeling or flu-like symptoms -headache -irregular menstrual bleeding -nausea -sore throat -vaginal irritation or inflammation This list may not describe all possible side effects. Call your doctor for medical advice about side effects. You may report side effects to FDA at 1-800-FDA-1088. Where should I keep my medicine? This drug is given in a hospital or clinic and will not be stored at home. NOTE: This sheet is a summary. It may not cover all possible information. If you have questions about this medicine, talk to your doctor, pharmacist, or health care provider.    2016, Elsevier/Gold Standard. (2014-01-20 14:07:06)

## 2015-05-18 NOTE — Telephone Encounter (Signed)
Mother wants to know if someone else could do her nexplanon insertation by another dr.  She is scheduled to have it done Feb 9 at [facility].  Baby was born [birth year].  appts are available with neal on feb 8 but she will be not be available to come to appt that day. She would like to have this done as early as today or next week.

## 2015-05-24 ENCOUNTER — Ambulatory Visit: Payer: 59 | Admitting: Family Medicine

## 2015-05-31 ENCOUNTER — Ambulatory Visit: Payer: 59 | Admitting: Obstetrics and Gynecology

## 2015-06-27 DIAGNOSIS — L906 Striae atrophicae: Secondary | ICD-10-CM | POA: Diagnosis not present

## 2015-07-13 DIAGNOSIS — H5213 Myopia, bilateral: Secondary | ICD-10-CM | POA: Diagnosis not present

## 2015-08-15 ENCOUNTER — Encounter: Payer: 59 | Admitting: Obstetrics and Gynecology

## 2015-09-17 ENCOUNTER — Ambulatory Visit
Admission: EM | Admit: 2015-09-17 | Discharge: 2015-09-17 | Disposition: A | Discharge status: Home / Self Care | Payer: 59 | Attending: Family Medicine | Admitting: Family Medicine

## 2015-09-17 ENCOUNTER — Encounter: Payer: Self-pay | Admitting: Emergency Medicine

## 2015-09-17 DIAGNOSIS — N61 Mastitis without abscess: Secondary | ICD-10-CM

## 2015-09-17 MED ORDER — SULFAMETHOXAZOLE-TRIMETHOPRIM 800-160 MG PO TABS: 1.0000 | ORAL_TABLET | Freq: Two times a day (BID) | ORAL | Status: DC

## 2015-09-17 MED ORDER — KETOROLAC TROMETHAMINE 60 MG/2ML IM SOLN
60.0000 mg | Freq: Once | INTRAMUSCULAR | Status: AC
  Administered 2015-09-17: 60 mg via INTRAMUSCULAR

## 2015-09-17 MED ORDER — SULFAMETHOXAZOLE-TRIMETHOPRIM 800-160 MG PO TABS
1.0000 | ORAL_TABLET | Freq: Two times a day (BID) | ORAL | Status: DC
  Administered 2015-09-17: 1 via ORAL

## 2015-09-17 NOTE — ED Provider Notes (Signed)
CSN: [id number]     Arrival date & time 09/17/15  1935 History   First MD Initiated Contact with Patient 09/17/15 2033     Chief Complaint  Patient presents with  . Fever  . Breast Pain   (Consider location/radiation/quality/duration/timing/severity/associated sxs/prior Treatment) HPI 25 yo AAF with 75 month old daughter-  Breastfeeding-- noted onset left lateral breast pain this morning. Over course of day became more severe This afternoon noted mild temp elevation and mild malaise. Presents for evaluation. Baby has recently started cutting teeth and has been tugging on nipple Mercedes Patel has a breast pump and is comfortable using it On menses today No medication allergies  Past Medical History  Diagnosis Date  . Medical history non-contributory   . Anemia    Past Surgical History  Procedure Laterality Date  . No past surgeries     Family History  Problem Relation Age of Onset  . Diabetes Paternal Grandmother   . Cancer Neg Hx   . Heart disease Neg Hx    Social History  Substance Use Topics  . Smoking status: Never Smoker   . Smokeless tobacco: Never Used  . Alcohol Use: No   OB History    Gravida Para Term Preterm AB TAB SAB Ectopic Multiple Living   1 1 1  0 0 0 0 0 0 1     Review of Systems Review of 10 systems negative for acute change except as referenced in HPI   Allergies  Review of patient's allergies indicates no known allergies.  Home Medications   Prior to Admission medications   Medication Sig Start Date End Date Taking? Authorizing Provider  Prenatal Vit-Fe Fumarate-FA (MULTIVITAMIN-PRENATAL) 27-0.8 MG TABS tablet Take 1 tablet by mouth daily at 12 noon.    Historical Provider, MD  sulfamethoxazole-trimethoprim (BACTRIM DS,SEPTRA DS) 800-160 MG tablet Take 1 tablet by mouth 2 (two) times daily. 09/18/15 09/22/15  Jan Fireman, PA-C   Meds Ordered and Administered this Visit   Medications  sulfamethoxazole-trimethoprim (BACTRIM DS,SEPTRA DS) 800-160 MG per  tablet 1 tablet (1 tablet Oral Given 09/17/15 2059)  ketorolac (TORADOL) injection 60 mg (60 mg Intramuscular Given 09/17/15 2051)  well tolerated- pain was easing by the time patient was prepared for discharge  BP 101/63 mmHg  Pulse 101  Temp(Src) 98.2 F (36.8 C)  Resp 18  Ht 5\' 4"  (1.626 m)  Wt 117 lb (53.071 kg)  BMI 20.07 kg/m2  SpO2 99%  LMP 09/15/2015 (Approximate)  Breastfeeding? Yes No data found.   Physical Exam  General: NAD, does not appear toxic, obviously uncomfortable HEENT:normocephalic,atraumatic, mucous membranes moist,grossly normal hearing Eyes: EOMI, conjunctiva clear, conjugate gaze Neck: supple,no lymphadenopathy Resp : normal respiratory effort Card : RRR Breast well developed , nipples everted, left breast larger than right (consistently so per patient). Left breast very tender particularly from 2 to 5 o'clock laterally; Medium erythematous flush over the skin in same area,nipple intact no injury noted Skin: no rash, skin intact MSK: no deformities, ambulatory without assistance Neuro : good attention,recall-good memory, no focal neuro deficits Psych: speech and behavior appropriate   ED Course  Procedures (including critical care time)  Labs Review Labs Reviewed - No data to display  Imaging Review No results found.  Discussed lactation mastitis with patient and encouraged her to empty the breast with her pump while the Toradol is effective, thumb massage toward the nipple from distal lateral breast. Given the erythema have initiated Septra DS after consulting [facility]act[facility]- Up To Date -  and Dr Zenda Alpers, plan BID for 5 days; continuing to empty breast MDM  .   Diagnosis and treatment discussed.-handout given  Questions fielded, expectations and recommendations reviewed. Discussed follow up and return parameters including no resolution or any worsening condition. Contact OB-Gyn office in AM and follow up with plan to see them on Wednesday ( 2 days) .  Patient expresses understanding  and agrees to plan.  Will return to [facility] with questions, concerns or exacerbation.  1. Mastitis, acute    Discharge Medication List as of 09/17/2015  9:07 PM    START taking these medications   Details  sulfamethoxazole-trimethoprim (BACTRIM DS,SEPTRA DS) 800-160 MG tablet Take 1 tablet by mouth 2 (two) times daily., Starting 09/18/2015, Until Sat 09/22/15, Normal            Jan Fireman, PA-C 09/17/15 2136

## 2015-09-17 NOTE — Discharge Instructions (Signed)
Emptying the breast is the key to improving the breast tenderness.. Use you r pump becasue you have it available Thumb massage the breast toward the nipple as you pump to help empty the clogged breasrt  Sometimes simply emptying makes a huge difference... Sometimes antibitoics are needed  We started antibioitic tonight as discussed with you because of the holiday weekend and decreased access Please touch base with your OB _GYN tomorrow  And plan to see as scheduled Wednesday Breastfeeding and Mastitis Mastitis is inflammation of the breast tissue. It can occur in women who are breastfeeding. This can make breastfeeding painful. Mastitis will sometimes go away on its own. Your health care provider will help determine if treatment is needed. CAUSES Mastitis is often associated with a blocked milk (lactiferous) duct. This can happen when too much milk builds up in the breast. Causes of excess milk in the breast can include:  Poor latch-on. If your baby is not latched onto the breast properly, she or he may not empty your breast completely while breastfeeding.  Allowing too much time to pass between feedings.  Wearing a bra or other clothing that is too tight. This puts extra pressure on the lactiferous ducts so milk does not flow through them as it should. Mastitis can also be caused by a bacterial infection. Bacteria may enter the breast tissue through cuts or openings in the skin. In women who are breastfeeding, this may occur because of cracked or irritated skin. Cracks in the skin are often caused when your baby does not latch on properly to the breast. SIGNS AND SYMPTOMS  Swelling, redness, tenderness, and pain in an area of the breast.  Swelling of the glands under the arm on the same side.  Fever may or may not accompany mastitis. If an infection is allowed to progress, a collection of pus (abscess) may develop. DIAGNOSIS  Your health care provider can usually diagnose mastitis  based on your symptoms and a physical exam. Tests may be done to help confirm the diagnosis. These may include:  Removal of pus from the breast by applying pressure to the area. This pus can be examined in the lab to determine which bacteria are present. If an abscess has developed, the fluid in the abscess can be removed with a needle. This can also be used to confirm the diagnosis and determine the bacteria present. In most cases, pus will not be present.  Blood tests to determine if your body is fighting a bacterial infection.  Mammogram or ultrasound tests to rule out other problems or diseases. TREATMENT  Mastitis that occurs with breastfeeding will sometimes go away on its own. Your health care provider may choose to wait 24 hours after first seeing you to decide whether a prescription medicine is needed. If your symptoms are worse after 24 hours, your health care provider will likely prescribe an antibiotic medicine to treat the mastitis. He or she will determine which bacteria are most likely causing the infection and will then select an appropriate antibiotic medicine. This is sometimes changed based on the results of tests performed to identify the bacteria, or if there is no response to the antibiotic medicine selected. Antibiotic medicines are usually given by mouth. You may also be given medicine for pain. HOME CARE INSTRUCTIONS  Only take over-the-counter or prescription medicines for pain, fever, or discomfort as directed by your health care provider.  If your health care provider prescribed an antibiotic medicine, take the medicine as directed.  Make sure you finish it even if you start to feel better.  Do not wear a tight or underwire bra. Wear a soft, supportive bra.  Increase your fluid intake, especially if you have a fever.  Continue to empty the breast. Your health care provider can tell you whether this milk is safe for your infant or needs to be thrown out. You may be told  to stop nursing until your health care provider thinks it is safe for your baby. Use a breast pump if you are advised to stop nursing.  Keep your nipples clean and dry.  Empty the first breast completely before going to the other breast. If your baby is not emptying your breasts completely for some reason, use a breast pump to empty your breasts.  If you go back to work, pump your breasts while at work to stay in time with your nursing schedule.  Avoid allowing your breasts to become overly filled with milk (engorged). SEEK MEDICAL CARE IF:  You have pus-like discharge from the breast.  Your symptoms do not improve with the treatment prescribed by your health care provider within 2 days. SEEK IMMEDIATE MEDICAL CARE IF:  Your pain and swelling are getting worse.  You have pain that is not controlled with medicine.  You have a red line extending from the breast toward your armpit.  You have a fever or persistent symptoms for more than 2-3 days.  You have a fever and your symptoms suddenly get worse. MAKE SURE YOU:   Understand these instructions.  Will watch your condition.  Will get help right away if you are not doing well or get worse.   This information is not intended to replace advice given to you by your health care provider. Make sure you discuss any questions you have with your health care provider.   Document Released: 08/02/2004 Document Revised: 04/12/2013 Document Reviewed: 11/11/2012 Elsevier Interactive Patient Education [employer].

## 2015-09-17 NOTE — ED Notes (Signed)
Pt reports she is breast feeding and started having pain in left breast, but then later chills and aches, and fever. Concerned for mastitis.

## 2015-09-19 ENCOUNTER — Ambulatory Visit (INDEPENDENT_AMBULATORY_CARE_PROVIDER_SITE_OTHER): Payer: 59 | Admitting: Obstetrics and Gynecology

## 2015-09-19 ENCOUNTER — Encounter: Payer: Self-pay | Admitting: Obstetrics and Gynecology

## 2015-09-19 VITALS — BP 115/66 | HR 79 | Ht 64.0 in | Wt 113.3 lb

## 2015-09-19 DIAGNOSIS — Z01419 Encounter for gynecological examination (general) (routine) without abnormal findings: Secondary | ICD-10-CM

## 2015-09-19 DIAGNOSIS — O9122 Nonpurulent mastitis associated with the puerperium: Secondary | ICD-10-CM | POA: Diagnosis not present

## 2015-09-19 MED ORDER — DICLOXACILLIN SODIUM 250 MG PO CAPS: 250.0000 mg | ORAL_CAPSULE | Freq: Four times a day (QID) | ORAL | Status: DC

## 2015-09-19 MED ORDER — IBUPROFEN 800 MG PO TABS: 800.0000 mg | ORAL_TABLET | Freq: Three times a day (TID) | ORAL | Status: DC | PRN

## 2015-09-19 NOTE — Progress Notes (Addendum)
GYNECOLOGY ANNUAL PHYSICAL EXAM PROGRESS NOTE  Subjective:    Mercedes Patel is a 25 y.o. G74P1001 female who presents for an annual exam. The patient is sexually active. The patient wears seatbelts: yes. The patient participates in regular exercise: yes. Has the patient ever been transfused or tattooed?: no. The patient reports that there is not domestic violence in her life.   The patient has no complaints today. 1. Mastitis - patient reports being seen in urgent care earlier this week due to complaints of intense breast sorenses and low grad fever with general malaise.  Was given an injection of Toradol for pain and prescribed Septra, however after 1 day of taking antibiotic, noted a generalized rash over arms and legs. Discontinued use.  Rash has subsided affter taking Benadryl.   Gynecologic History Patient's last menstrual period was 09/15/2015 (approximate). Menarche age: 11 Contraception: Nexplanon (inserted 04/2015) History of STI's: Denies Last Pap: 03/16/2013. Results were: normal.  Denies h/o abnormal pap smears.    Obstetric History   G1   P1   T1   P0   A0   TAB0   SAB0   E0   M0   L1     # Outcome Date GA Lbr Len/2nd Weight Sex Delivery Anes PTL Lv  1 Term 04/02/15 [redacted]w[redacted]d 14:50 / 01:50 5 lb 13.3 oz (2.645 kg) F Vag-Spont EPI       Name: Mercedes Patel     Apgar1:  7                Apgar5: 8      Past Medical History  Diagnosis Date  . Anemia   . Short cervical length during pregnancy 2016    required progesterone supplementation during pregnancy    Past Surgical History  Procedure Laterality Date  . No past surgeries      Family History  Problem Relation Age of Onset  . Diabetes Paternal Grandmother   . Cancer Neg Hx   . Heart disease Neg Hx     Social History   Social History  . Marital Status: Single    Spouse Name: N/A  . Number of Children: N/A  . Years of Education: N/A   Occupational History  . Not on file.   Social History Main  Topics  . Smoking status: Never Smoker   . Smokeless tobacco: Never Used  . Alcohol Use: No  . Drug Use: No  . Sexual Activity: Yes    Birth Control/ Protection: Implant   Other Topics Concern  . Not on file   Social History Narrative    Current Outpatient Prescriptions on File Prior to Visit  Medication Sig Dispense Refill  . Prenatal Vit-Fe Fumarate-FA (MULTIVITAMIN-PRENATAL) 27-0.8 MG TABS tablet Take 1 tablet by mouth daily at 12 noon.     No current facility-administered medications on file prior to visit.    Allergies  Allergen Reactions  . Septra Ds [Sulfamethoxazole-Trimethoprim]      Review of Systems Constitutional: negative for chills, fatigue, fevers and sweats Eyes: negative for irritation, redness and visual disturbance Ears, nose, mouth, throat, and face: negative for hearing loss, nasal congestion, snoring and tinnitus Respiratory: negative for asthma, cough, sputum Cardiovascular: negative for chest pain, dyspnea, exertional chest pressure/discomfort, irregular heart beat, palpitations and syncope Gastrointestinal: negative for abdominal pain, change in bowel habits, nausea and vomiting Genitourinary: negative for abnormal menstrual periods, genital lesions, sexual problems and vaginal discharge, dysuria and urinary incontinence Integument/breast: positive for breast  tenderness; negative for breast lump, and nipple discharge. Skin positive for rash, resolving.  Hematologic/lymphatic: negative for bleeding and easy bruising Musculoskeletal:negative for back pain and muscle weakness Neurological: negative for dizziness, headaches, vertigo and weakness Endocrine: negative for diabetic symptoms including polydipsia, polyuria and skin dryness Allergic/Immunologic: negative for hay fever and urticaria       Objective:  Blood pressure 115/66, pulse 79, height 5\' 4"  (1.626 m), weight 113 lb 4.8 oz (51.393 kg), last menstrual period 09/15/2015, currently  breastfeeding. Body mass index is 19.44 kg/(m^2).  General Appearance:    Alert, cooperative, no distress, appears stated age  Head:    Normocephalic, without obvious abnormality, atraumatic  Eyes:    PERRL, conjunctiva/corneas clear, EOM's intact, both eyes  Ears:    Normal external ear canals, both ears  Nose:   Nares normal, septum midline, mucosa normal, no drainage or sinus tenderness  Throat:   Lips, mucosa, and tongue normal; teeth and gums normal  Neck:   Supple, symmetrical, trachea midline, no adenopathy; thyroid: no enlargement/tenderness/nodules; no carotid bruit or JVD  Back:     Symmetric, no curvature, ROM normal, no CVA tenderness  Lungs:     Clear to auscultation bilaterally, respirations unlabored  Chest Wall:    No tenderness or deformity   Heart:    Regular rate and rhythm, S1 and S2 normal, no murmur, rub or gallop  Breast Exam:    Moderate tenderness and nodularity of breast bilaterally, (L>R); no rash noted, no masses, or nipple abnormality  Abdomen:     Soft, non-tender, bowel sounds active all four quadrants, no masses, no organomegaly.    Genitalia:    Pelvic:external genitalia normal, vagina without lesions, discharge, or tenderness, rectovaginal septum  normal. Cervix normal in appearance, no cervical motion tenderness, no adnexal masses or tenderness.  Uterus normal size, shape, mobile, regular contours, nontender.  Rectal:    Normal external sphincter.  No hemorrhoids appreciated. Internal exam not done.   Extremities:   Extremities normal, atraumatic, no cyanosis or edema  Pulses:   2+ and symmetric all extremities  Skin:   Skin color, texture, turgor normal, no rashes or lesions  Lymph nodes:   Cervical, supraclavicular, and axillary nodes normal  Neurologic:   CNII-XII intact, normal strength, sensation and reflexes throughout    Labs:  Lab Results  Component Value Date   WBC 8.3 04/03/2015   HGB 11.4* 04/03/2015   HCT 33.4* 04/03/2015   MCV 89.4  04/03/2015   PLT 169 04/03/2015    Lab Results  Component Value Date   TSH 1.365 01/03/2009     Assessment:   Healthy female exam.  Normal weight Mastitis, postpartum (patient still exclusively breastfeeding)  Plan:    Blood tests: CBC with diff and Comprehensive metabolic panel. Breast self exam technique reviewed and patient encouraged to perform self-exam monthly. Contraception: Nexplanon. Discussed healthy lifestyle modifications. Pap smear up to date.  Mastitis - Patient discontinued Septra, will change to Dicloxacillin. Continue Benadryl prn.  To follow up in 1 year.    [name], MD [facility]

## 2015-09-19 NOTE — Patient Instructions (Signed)
Preventive Care for Adults, Female A healthy lifestyle and preventive care can promote health and wellness. Preventive health guidelines for women include the following key practices.  A routine yearly physical is a good way to check with your health care provider about your health and preventive screening. It is a chance to share any concerns and updates on your health and to receive a thorough exam.  Visit your dentist for a routine exam and preventive care every 6 months. Brush your teeth twice a day and floss once a day. Good oral hygiene prevents tooth decay and gum disease.  The frequency of eye exams is based on your age, health, family medical history, use of contact lenses, and other factors. Follow your health care provider's recommendations for frequency of eye exams.  Eat a healthy diet. Foods like vegetables, fruits, whole grains, low-fat dairy products, and lean protein foods contain the nutrients you need without too many calories. Decrease your intake of foods high in solid fats, added sugars, and salt. Eat the right amount of calories for you.Get information about a proper diet from your health care provider, if necessary.  Regular physical exercise is one of the most important things you can do for your health. Most adults should get at least 150 minutes of moderate-intensity exercise (any activity that increases your heart rate and causes you to sweat) each week. In addition, most adults need muscle-strengthening exercises on 2 or more days a week.  Maintain a healthy weight. The body mass index (BMI) is a screening tool to identify possible weight problems. It provides an estimate of body fat based on height and weight. Your health care provider can find your BMI and can help you achieve or maintain a healthy weight.For adults 20 years and older:  A BMI below 18.5 is considered underweight.  A BMI of 18.5 to 24.9 is normal.  A BMI of 25 to 29.9 is considered  overweight.  A BMI of 30 and above is considered obese.  Maintain normal blood lipids and cholesterol levels by exercising and minimizing your intake of saturated fat. Eat a balanced diet with plenty of fruit and vegetables. Blood tests for lipids and cholesterol should begin at age 64 and be repeated every 5 years. If your lipid or cholesterol levels are high, you are over 50, or you are at high risk for heart disease, you may need your cholesterol levels checked more frequently.Ongoing high lipid and cholesterol levels should be treated with medicines if diet and exercise are not working.  If you smoke, find out from your health care provider how to quit. If you do not use tobacco, do not start.  Lung cancer screening is recommended for adults aged 52-80 years who are at high risk for developing lung cancer because of a history of smoking. A yearly low-dose CT scan of the lungs is recommended for people who have at least a 30-pack-year history of smoking and are a current smoker or have quit within the past 15 years. A pack year of smoking is smoking an average of 1 pack of cigarettes a day for 1 year (for example: 1 pack a day for 30 years or 2 packs a day for 15 years). Yearly screening should continue until the smoker has stopped smoking for at least 15 years. Yearly screening should be stopped for people who develop a health problem that would prevent them from having lung cancer treatment.  If you are pregnant, do not drink alcohol. If you are  breastfeeding, be very cautious about drinking alcohol. If you are not pregnant and choose to drink alcohol, do not have more than 1 drink per day. One drink is considered to be 12 ounces (355 mL) of beer, 5 ounces (148 mL) of wine, or 1.5 ounces (44 mL) of liquor.  Avoid use of street drugs. Do not share needles with anyone. Ask for help if you need support or instructions about stopping the use of drugs.  High blood pressure causes heart disease and  increases the risk of stroke. Your blood pressure should be checked at least every 1 to 2 years. Ongoing high blood pressure should be treated with medicines if weight loss and exercise do not work.  If you are 25-78 years old, ask your health care provider if you should take aspirin to prevent strokes.  Diabetes screening is done by taking a blood sample to check your blood glucose level after you have not eaten for a certain period of time (fasting). If you are not overweight and you do not have risk factors for diabetes, you should be screened once every 3 years starting at age 86. If you are overweight or obese and you are 3-87 years of age, you should be screened for diabetes every year as part of your cardiovascular risk assessment.  Breast cancer screening is essential preventive care for women. You should practice "breast self-awareness." This means understanding the normal appearance and feel of your breasts and may include breast self-examination. Any changes detected, no matter how small, should be reported to a health care provider. Women in their 66s and 30s should have a clinical breast exam (CBE) by a health care provider as part of a regular health exam every 1 to 3 years. After age 43, women should have a CBE every year. Starting at age 37, women should consider having a mammogram (breast X-ray test) every year. Women who have a family history of breast cancer should talk to their health care provider about genetic screening. Women at a high risk of breast cancer should talk to their health care providers about having an MRI and a mammogram every year.  Breast cancer gene (BRCA)-related cancer risk assessment is recommended for women who have family members with BRCA-related cancers. BRCA-related cancers include breast, ovarian, tubal, and peritoneal cancers. Having family members with these cancers may be associated with an increased risk for harmful changes (mutations) in the breast  cancer genes BRCA1 and BRCA2. Results of the assessment will determine the need for genetic counseling and BRCA1 and BRCA2 testing.  Your health care provider may recommend that you be screened regularly for cancer of the pelvic organs (ovaries, uterus, and vagina). This screening involves a pelvic examination, including checking for microscopic changes to the surface of your cervix (Pap test). You may be encouraged to have this screening done every 3 years, beginning at age 78.  For women ages 79-65, health care providers may recommend pelvic exams and Pap testing every 3 years, or they may recommend the Pap and pelvic exam, combined with testing for human papilloma virus (HPV), every 5 years. Some types of HPV increase your risk of cervical cancer. Testing for HPV may also be done on women of any age with unclear Pap test results.  Other health care providers may not recommend any screening for nonpregnant women who are considered low risk for pelvic cancer and who do not have symptoms. Ask your health care provider if a screening pelvic exam is right for  you.  If you have had past treatment for cervical cancer or a condition that could lead to cancer, you need Pap tests and screening for cancer for at least 20 years after your treatment. If Pap tests have been discontinued, your risk factors (such as having a new sexual partner) need to be reassessed to determine if screening should resume. Some women have medical problems that increase the chance of getting cervical cancer. In these cases, your health care provider may recommend more frequent screening and Pap tests.  Colorectal cancer can be detected and often prevented. Most routine colorectal cancer screening begins at the age of 50 years and continues through age 75 years. However, your health care provider may recommend screening at an earlier age if you have risk factors for colon cancer. On a yearly basis, your health care provider may provide  home test kits to check for hidden blood in the stool. Use of a small camera at the end of a tube, to directly examine the colon (sigmoidoscopy or colonoscopy), can detect the earliest forms of colorectal cancer. Talk to your health care provider about this at age 50, when routine screening begins. Direct exam of the colon should be repeated every 5-10 years through age 75 years, unless early forms of precancerous polyps or small growths are found.  People who are at an increased risk for hepatitis B should be screened for this virus. You are considered at high risk for hepatitis B if:  You were born in a country where hepatitis B occurs often. Talk with your health care provider about which countries are considered high risk.  Your parents were born in a high-risk country and you have not received a shot to protect against hepatitis B (hepatitis B vaccine).  You have HIV or AIDS.  You use needles to inject street drugs.  You live with, or have sex with, someone who has hepatitis B.  You get hemodialysis treatment.  You take certain medicines for conditions like cancer, organ transplantation, and autoimmune conditions.  Hepatitis C blood testing is recommended for all people born from 1945 through 1965 and any individual with known risks for hepatitis C.  Practice safe sex. Use condoms and avoid high-risk sexual practices to reduce the spread of sexually transmitted infections (STIs). STIs include gonorrhea, chlamydia, syphilis, trichomonas, herpes, HPV, and human immunodeficiency virus (HIV). Herpes, HIV, and HPV are viral illnesses that have no cure. They can result in disability, cancer, and death.  You should be screened for sexually transmitted illnesses (STIs) including gonorrhea and chlamydia if:  You are sexually active and are younger than 24 years.  You are older than 24 years and your health care provider tells you that you are at risk for this type of infection.  Your sexual  activity has changed since you were last screened and you are at an increased risk for chlamydia or gonorrhea. Ask your health care provider if you are at risk.  If you are at risk of being infected with HIV, it is recommended that you take a prescription medicine daily to prevent HIV infection. This is called preexposure prophylaxis (PrEP). You are considered at risk if:  You are sexually active and do not regularly use condoms or know the HIV status of your partner(s).  You take drugs by injection.  You are sexually active with a partner who has HIV.  Talk with your health care provider about whether you are at high risk of being infected with HIV. If   you choose to begin PrEP, you should first be tested for HIV. You should then be tested every 3 months for as long as you are taking PrEP.  Osteoporosis is a disease in which the bones lose minerals and strength with aging. This can result in serious bone fractures or breaks. The risk of osteoporosis can be identified using a bone density scan. Women ages 1 years and over and women at risk for fractures or osteoporosis should discuss screening with their health care providers. Ask your health care provider whether you should take a calcium supplement or vitamin D to reduce the rate of osteoporosis.  Menopause can be associated with physical symptoms and risks. Hormone replacement therapy is available to decrease symptoms and risks. You should talk to your health care provider about whether hormone replacement therapy is right for you.  Use sunscreen. Apply sunscreen liberally and repeatedly throughout the day. You should seek shade when your shadow is shorter than you. Protect yourself by wearing long sleeves, pants, a wide-brimmed hat, and sunglasses year round, whenever you are outdoors.  Once a month, do a whole body skin exam, using a mirror to look at the skin on your back. Tell your health care provider of new moles, moles that have irregular  borders, moles that are larger than a pencil eraser, or moles that have changed in shape or color.  Stay current with required vaccines (immunizations).  Influenza vaccine. All adults should be immunized every year.  Tetanus, diphtheria, and acellular pertussis (Td, Tdap) vaccine. Pregnant women should receive 1 dose of Tdap vaccine during each pregnancy. The dose should be obtained regardless of the length of time since the last dose. Immunization is preferred during the 27th-36th week of gestation. An adult who has not previously received Tdap or who does not know her vaccine status should receive 1 dose of Tdap. This initial dose should be followed by tetanus and diphtheria toxoids (Td) booster doses every 10 years. Adults with an unknown or incomplete history of completing a 3-dose immunization series with Td-containing vaccines should begin or complete a primary immunization series including a Tdap dose. Adults should receive a Td booster every 10 years.  Varicella vaccine. An adult without evidence of immunity to varicella should receive 2 doses or a second dose if she has previously received 1 dose. Pregnant females who do not have evidence of immunity should receive the first dose after pregnancy. This first dose should be obtained before leaving the health care facility. The second dose should be obtained 4-8 weeks after the first dose.  Human papillomavirus (HPV) vaccine. Females aged 13-26 years who have not received the vaccine previously should obtain the 3-dose series. The vaccine is not recommended for use in pregnant females. However, pregnancy testing is not needed before receiving a dose. If a female is found to be pregnant after receiving a dose, no treatment is needed. In that case, the remaining doses should be delayed until after the pregnancy. Immunization is recommended for any person with an immunocompromised condition through the age of 24 years if she did not get any or all doses  earlier. During the 3-dose series, the second dose should be obtained 4-8 weeks after the first dose. The third dose should be obtained 24 weeks after the first dose and 16 weeks after the second dose.  Zoster vaccine. One dose is recommended for adults aged 97 years or older unless certain conditions are present.  Measles, mumps, and rubella (MMR) vaccine. Adults born  before 1957 generally are considered immune to measles and mumps. Adults born in [birth year] or later should have 1 or more doses of MMR vaccine unless there is a contraindication to the vaccine or there is laboratory evidence of immunity to each of the three diseases. A routine second dose of MMR vaccine should be obtained at least 28 days after the first dose for students attending postsecondary schools, health care workers, or international travelers. People who received inactivated measles vaccine or an unknown type of measles vaccine during 1963-1967 should receive 2 doses of MMR vaccine. People who received inactivated mumps vaccine or an unknown type of mumps vaccine before 1979 and are at high risk for mumps infection should consider immunization with 2 doses of MMR vaccine. For females of childbearing age, rubella immunity should be determined. If there is no evidence of immunity, females who are not pregnant should be vaccinated. If there is no evidence of immunity, females who are pregnant should delay immunization until after pregnancy. Unvaccinated health care workers born before 60 who lack laboratory evidence of measles, mumps, or rubella immunity or laboratory confirmation of disease should consider measles and mumps immunization with 2 doses of MMR vaccine or rubella immunization with 1 dose of MMR vaccine.  Pneumococcal 13-valent conjugate (PCV13) vaccine. When indicated, a person who is uncertain of his immunization history and has no record of immunization should receive the PCV13 vaccine. All adults 61 years of age and older  should receive this vaccine. An adult aged 92 years or older who has certain medical conditions and has not been previously immunized should receive 1 dose of PCV13 vaccine. This PCV13 should be followed with a dose of pneumococcal polysaccharide (PPSV23) vaccine. Adults who are at high risk for pneumococcal disease should obtain the PPSV23 vaccine at least 8 weeks after the dose of PCV13 vaccine. Adults older than 26 years of age who have normal immune system function should obtain the PPSV23 vaccine dose at least 1 year after the dose of PCV13 vaccine.  Pneumococcal polysaccharide (PPSV23) vaccine. When PCV13 is also indicated, PCV13 should be obtained first. All adults aged 2 years and older should be immunized. An adult younger than age 30 years who has certain medical conditions should be immunized. Any person who resides in a nursing home or long-term care facility should be immunized. An adult smoker should be immunized. People with an immunocompromised condition and certain other conditions should receive both PCV13 and PPSV23 vaccines. People with human immunodeficiency virus (HIV) infection should be immunized as soon as possible after diagnosis. Immunization during chemotherapy or radiation therapy should be avoided. Routine use of PPSV23 vaccine is not recommended for American Indians, [place] Natives, or people younger than 65 years unless there are medical conditions that require PPSV23 vaccine. When indicated, people who have unknown immunization and have no record of immunization should receive PPSV23 vaccine. One-time revaccination 5 years after the first dose of PPSV23 is recommended for people aged 19-64 years who have chronic kidney failure, nephrotic syndrome, asplenia, or immunocompromised conditions. People who received 1-2 doses of PPSV23 before age 44 years should receive another dose of PPSV23 vaccine at age 83 years or later if at least 5 years have passed since the previous dose. Doses  of PPSV23 are not needed for people immunized with PPSV23 at or after age 20 years.  Meningococcal vaccine. Adults with asplenia or persistent complement component deficiencies should receive 2 doses of quadrivalent meningococcal conjugate (MenACWY-D) vaccine. The doses should be obtained  at least 2 months apart. Microbiologists working with certain meningococcal bacteria, [place] recruits, people at risk during an outbreak, and people who travel to or live in countries with a high rate of meningitis should be immunized. A first-year college student up through age 28 years who is living in a residence hall should receive a dose if she did not receive a dose on or after her 16th birthday. Adults who have certain high-risk conditions should receive one or more doses of vaccine.  Hepatitis A vaccine. Adults who wish to be protected from this disease, have certain high-risk conditions, work with hepatitis A-infected animals, work in hepatitis A research labs, or travel to or work in countries with a high rate of hepatitis A should be immunized. Adults who were previously unvaccinated and who anticipate close contact with an international adoptee during the first 60 days after arrival in the [place] from a country with a high rate of hepatitis A should be immunized.  Hepatitis B vaccine. Adults who wish to be protected from this disease, have certain high-risk conditions, may be exposed to blood or other infectious body fluids, are household contacts or sex partners of hepatitis B positive people, are clients or workers in certain care facilities, or travel to or work in countries with a high rate of hepatitis B should be immunized.  Haemophilus influenzae type b (Hib) vaccine. A previously unvaccinated person with asplenia or sickle cell disease or having a scheduled splenectomy should receive 1 dose of Hib vaccine. Regardless of previous immunization, a recipient of a hematopoietic stem cell transplant  should receive a 3-dose series 6-12 months after her successful transplant. Hib vaccine is not recommended for adults with HIV infection. Preventive Services / Frequency Ages 71 to 87 years  Blood pressure check.** / Every 3-5 years.  Lipid and cholesterol check.** / Every 5 years beginning at age 1.  Clinical breast exam.** / Every 3 years for women in their 3s and 31s.  BRCA-related cancer risk assessment.** / For women who have family members with a BRCA-related cancer (breast, ovarian, tubal, or peritoneal cancers).  Pap test.** / Every 2 years from ages 50 through 86. Every 3 years starting at age 87 through age 7 or 75 with a history of 3 consecutive normal Pap tests.  HPV screening.** / Every 3 years from ages 59 through ages 35 to 6 with a history of 3 consecutive normal Pap tests.  Hepatitis C blood test.** / For any individual with known risks for hepatitis C.  Skin self-exam. / Monthly.  Influenza vaccine. / Every year.  Tetanus, diphtheria, and acellular pertussis (Tdap, Td) vaccine.** / Consult your health care provider. Pregnant women should receive 1 dose of Tdap vaccine during each pregnancy. 1 dose of Td every 10 years.  Varicella vaccine.** / Consult your health care provider. Pregnant females who do not have evidence of immunity should receive the first dose after pregnancy.  HPV vaccine. / 3 doses over 6 months, if 72 and younger. The vaccine is not recommended for use in pregnant females. However, pregnancy testing is not needed before receiving a dose.  Measles, mumps, rubella (MMR) vaccine.** / You need at least 1 dose of MMR if you were born in 1957 or later. You may also need a 2nd dose. For females of childbearing age, rubella immunity should be determined. If there is no evidence of immunity, females who are not pregnant should be vaccinated. If there is no evidence of immunity, females who are  pregnant should delay immunization until after  pregnancy.  Pneumococcal 13-valent conjugate (PCV13) vaccine.** / Consult your health care provider.  Pneumococcal polysaccharide (PPSV23) vaccine.** / 1 to 2 doses if you smoke cigarettes or if you have certain conditions.  Meningococcal vaccine.** / 1 dose if you are age 87 to 44 years and a Market researcher living in a residence hall, or have one of several medical conditions, you need to get vaccinated against meningococcal disease. You may also need additional booster doses.  Hepatitis A vaccine.** / Consult your health care provider.  Hepatitis B vaccine.** / Consult your health care provider.  Haemophilus influenzae type b (Hib) vaccine.** / Consult your health care provider. Ages 86 to 38 years  Blood pressure check.** / Every year.  Lipid and cholesterol check.** / Every 5 years beginning at age 49 years.  Lung cancer screening. / Every year if you are aged 71-80 years and have a 30-pack-year history of smoking and currently smoke or have quit within the past 15 years. Yearly screening is stopped once you have quit smoking for at least 15 years or develop a health problem that would prevent you from having lung cancer treatment.  Clinical breast exam.** / Every year after age 51 years.  BRCA-related cancer risk assessment.** / For women who have family members with a BRCA-related cancer (breast, ovarian, tubal, or peritoneal cancers).  Mammogram.** / Every year beginning at age 18 years and continuing for as long as you are in good health. Consult with your health care provider.  Pap test.** / Every 3 years starting at age 63 years through age 37 or 57 years with a history of 3 consecutive normal Pap tests.  HPV screening.** / Every 3 years from ages 41 years through ages 76 to 23 years with a history of 3 consecutive normal Pap tests.  Fecal occult blood test (FOBT) of stool. / Every year beginning at age 36 years and continuing until age 51 years. You may not need  to do this test if you get a colonoscopy every 10 years.  Flexible sigmoidoscopy or colonoscopy.** / Every 5 years for a flexible sigmoidoscopy or every 10 years for a colonoscopy beginning at age 36 years and continuing until age 35 years.  Hepatitis C blood test.** / For all people born from [birth year] through 1965 and any individual with known risks for hepatitis C.  Skin self-exam. / Monthly.  Influenza vaccine. / Every year.  Tetanus, diphtheria, and acellular pertussis (Tdap/Td) vaccine.** / Consult your health care provider. Pregnant women should receive 1 dose of Tdap vaccine during each pregnancy. 1 dose of Td every 10 years.  Varicella vaccine.** / Consult your health care provider. Pregnant females who do not have evidence of immunity should receive the first dose after pregnancy.  Zoster vaccine.** / 1 dose for adults aged 73 years or older.  Measles, mumps, rubella (MMR) vaccine.** / You need at least 1 dose of MMR if you were born in 1957 or later. You may also need a second dose. For females of childbearing age, rubella immunity should be determined. If there is no evidence of immunity, females who are not pregnant should be vaccinated. If there is no evidence of immunity, females who are pregnant should delay immunization until after pregnancy.  Pneumococcal 13-valent conjugate (PCV13) vaccine.** / Consult your health care provider.  Pneumococcal polysaccharide (PPSV23) vaccine.** / 1 to 2 doses if you smoke cigarettes or if you have certain conditions.  Meningococcal vaccine.** /  Consult your health care provider.  Hepatitis A vaccine.** / Consult your health care provider.  Hepatitis B vaccine.** / Consult your health care provider.  Haemophilus influenzae type b (Hib) vaccine.** / Consult your health care provider. Ages 80 years and over  Blood pressure check.** / Every year.  Lipid and cholesterol check.** / Every 5 years beginning at age 62 years.  Lung cancer  screening. / Every year if you are aged 32-80 years and have a 30-pack-year history of smoking and currently smoke or have quit within the past 15 years. Yearly screening is stopped once you have quit smoking for at least 15 years or develop a health problem that would prevent you from having lung cancer treatment.  Clinical breast exam.** / Every year after age 61 years.  BRCA-related cancer risk assessment.** / For women who have family members with a BRCA-related cancer (breast, ovarian, tubal, or peritoneal cancers).  Mammogram.** / Every year beginning at age 39 years and continuing for as long as you are in good health. Consult with your health care provider.  Pap test.** / Every 3 years starting at age 85 years through age 74 or 72 years with 3 consecutive normal Pap tests. Testing can be stopped between 65 and 70 years with 3 consecutive normal Pap tests and no abnormal Pap or HPV tests in the past 10 years.  HPV screening.** / Every 3 years from ages 55 years through ages 67 or 77 years with a history of 3 consecutive normal Pap tests. Testing can be stopped between 65 and 70 years with 3 consecutive normal Pap tests and no abnormal Pap or HPV tests in the past 10 years.  Fecal occult blood test (FOBT) of stool. / Every year beginning at age 81 years and continuing until age 22 years. You may not need to do this test if you get a colonoscopy every 10 years.  Flexible sigmoidoscopy or colonoscopy.** / Every 5 years for a flexible sigmoidoscopy or every 10 years for a colonoscopy beginning at age 67 years and continuing until age 22 years.  Hepatitis C blood test.** / For all people born from [birth year] through 1966 and any individual with known risks for hepatitis C.  Osteoporosis screening.** / A one-time screening for women ages 8 years and over and women at risk for fractures or osteoporosis.  Skin self-exam. / Monthly.  Influenza vaccine. / Every year.  Tetanus, diphtheria, and  acellular pertussis (Tdap/Td) vaccine.** / 1 dose of Td every 10 years.  Varicella vaccine.** / Consult your health care provider.  Zoster vaccine.** / 1 dose for adults aged 56 years or older.  Pneumococcal 13-valent conjugate (PCV13) vaccine.** / Consult your health care provider.  Pneumococcal polysaccharide (PPSV23) vaccine.** / 1 dose for all adults aged 15 years and older.  Meningococcal vaccine.** / Consult your health care provider.  Hepatitis A vaccine.** / Consult your health care provider.  Hepatitis B vaccine.** / Consult your health care provider.  Haemophilus influenzae type b (Hib) vaccine.** / Consult your health care provider. ** Family history and personal history of risk and conditions may change your health care provider's recommendations.   This information is not intended to replace advice given to you by your health care provider. Make sure you discuss any questions you have with your health care provider.   Document Released: 06/03/2001 Document Revised: 04/28/2014 Document Reviewed: 09/02/2010 Elsevier Interactive Patient Education [employer].

## 2015-09-20 LAB — CBC
HEMOGLOBIN: 11.6 g/dL (ref 11.1–15.9)
Hematocrit: 34.8 % (ref 34.0–46.6)
MCH: 28.7 pg (ref 26.6–33.0)
MCHC: 33.3 g/dL (ref 31.5–35.7)
MCV: 86 fL (ref 79–97)
Platelets: 266 10*3/uL (ref 150–379)
RBC: 4.04 x10E6/uL (ref 3.77–5.28)
RDW: 14.5 % (ref 12.3–15.4)
WBC: 6.3 10*3/uL (ref 3.4–10.8)

## 2015-09-20 LAB — COMPREHENSIVE METABOLIC PANEL
ALBUMIN: 4.2 g/dL (ref 3.5–5.5)
ALT: 14 IU/L (ref 0–32)
AST: 16 IU/L (ref 0–40)
Albumin/Globulin Ratio: 1.4 (ref 1.2–2.2)
Alkaline Phosphatase: 95 IU/L (ref 39–117)
BILIRUBIN TOTAL: 0.4 mg/dL (ref 0.0–1.2)
BUN/Creatinine Ratio: 17 (ref 9–23)
BUN: 14 mg/dL (ref 6–20)
CALCIUM: 9 mg/dL (ref 8.7–10.2)
CHLORIDE: 101 mmol/L (ref 96–106)
CO2: 21 mmol/L (ref 18–29)
Creatinine, Ser: 0.81 mg/dL (ref 0.57–1.00)
GFR calc Af Amer: 117 mL/min/{1.73_m2} (ref >59)
GFR, EST NON AFRICAN AMERICAN: 101 mL/min/{1.73_m2} (ref >59)
GLUCOSE: 74 mg/dL (ref 65–99)
Globulin, Total: 2.9 g/dL (ref 1.5–4.5)
POTASSIUM: 5.1 mmol/L (ref 3.5–5.2)
SODIUM: 138 mmol/L (ref 134–144)
TOTAL PROTEIN: 7.1 g/dL (ref 6.0–8.5)

## 2015-09-23 ENCOUNTER — Encounter: Payer: Self-pay | Admitting: Obstetrics and Gynecology

## 2016-05-21 ENCOUNTER — Ambulatory Visit (INDEPENDENT_AMBULATORY_CARE_PROVIDER_SITE_OTHER): Payer: BLUE CROSS/BLUE SHIELD | Admitting: Family Medicine

## 2016-05-21 ENCOUNTER — Encounter: Payer: Self-pay | Admitting: Family Medicine

## 2016-05-21 VITALS — BP 118/68 | HR 95 | Temp 98.4°F | Ht 64.0 in | Wt 114.0 lb

## 2016-05-21 DIAGNOSIS — Z308 Encounter for other contraceptive management: Secondary | ICD-10-CM | POA: Diagnosis not present

## 2016-05-21 DIAGNOSIS — N926 Irregular menstruation, unspecified: Secondary | ICD-10-CM

## 2016-05-21 DIAGNOSIS — M940 Chondrocostal junction syndrome [Tietze]: Secondary | ICD-10-CM

## 2016-05-21 MED ORDER — IBUPROFEN 800 MG PO TABS: 800.0000 mg | ORAL_TABLET | Freq: Three times a day (TID) | ORAL | 5 refills | Status: DC | PRN

## 2016-05-21 MED ORDER — DESOGESTREL-ETHINYL ESTRADIOL 0.15-30 MG-MCG PO TABS: 1.0000 | ORAL_TABLET | Freq: Every day | ORAL | 3 refills | Status: DC

## 2016-05-21 NOTE — Progress Notes (Signed)
Patient year for physical, Pap smear and wants Nexplanon removed. I explained that we were only scheduled for physical today so she chose to put the CPE and Pap smear off and go and have her Nexplanon removed. She was removed because she's having bleeding for several days 3-4 weeks a month and this has not changed over the last 4-6 months. She does go back to the oral contraceptive pill.  PROCEDURE NOTE: [place] Patient given informed consent and signed copy in the chart. LEFT arm area prepped and draped in the usual sterile fashion. Three cc of lidocaine without epinephrine 1% used for local anesthesia. A small stab incision was made close to the nexplanon with scalpel. Hemostats were used to withdraw the nexplanon. A small bandage was applied over a steri strip  No complications.Patient given follow up instructions should she experience redness, swelling at sight or fever in the next 24 hours. Patient was reminded this totally removes her nexplanon contraceptive devise. (she can now potentially conceive)  Prescriptions for oral contraceptives was called in. Recommended she use barrier method of protection first month of oral contraceptives. He may take one or 2 months for her cycle to get regulated again. I'll have her schedule another appointment for her Pap smear and complete physical exam at her convenience.   She continues to have occasional recurrent episodes of costochondritis, particularly when there is extremely cold weather. She uses ibuprofen with success and I have refilled that for her as well.

## 2016-09-23 ENCOUNTER — Encounter: Payer: Self-pay | Admitting: Obstetrics and Gynecology

## 2016-09-23 ENCOUNTER — Ambulatory Visit (INDEPENDENT_AMBULATORY_CARE_PROVIDER_SITE_OTHER): Payer: BLUE CROSS/BLUE SHIELD | Admitting: Obstetrics and Gynecology

## 2016-09-23 VITALS — BP 120/68 | HR 89 | Ht 64.0 in | Wt 131.3 lb

## 2016-09-23 DIAGNOSIS — Z01419 Encounter for gynecological examination (general) (routine) without abnormal findings: Secondary | ICD-10-CM | POA: Diagnosis not present

## 2016-09-23 DIAGNOSIS — Z124 Encounter for screening for malignant neoplasm of cervix: Secondary | ICD-10-CM | POA: Diagnosis not present

## 2016-09-23 NOTE — Progress Notes (Signed)
GYNECOLOGY ANNUAL PHYSICAL EXAM PROGRESS NOTE  Subjective:    Mercedes Patel is a 26 y.o. G51P1001 female who presents for an annual exam. The patient is sexually active. The patient has no complaints today.The patient wears seatbelts: yes. The patient participates in regular exercise: yes. Has the patient ever been transfused or tattooed?: no. The patient reports that there is not domestic violence in her life.    Gynecologic History Patient's last menstrual period was 09/06/2016. Menarche age: 71 Contraception: OCP (estrogen/progesterone).  Had Nexplanon removed ~04/2016 due to heavy bleeding.  History of STI's: Denies Last Pap: 03/16/2013. Results were: normal.  Denies h/o abnormal pap smears.    Obstetric History   G1   P1   T1   P0   A0   L1    SAB0   TAB0   Ectopic0   Multiple0   Live Births1     # Outcome Date GA Lbr Len/2nd Weight Sex Delivery Anes PTL Lv  1 Term 04/02/15 [redacted]w[redacted]d 14:50 / 01:50 5 lb 13.3 oz (2.645 kg) F Vag-Spont EPI       Name: Blatt,GIRL Yatzary     Apgar1:  7                Apgar5: 8      Past Medical History  Diagnosis Date  . Anemia   . Short cervical length during pregnancy 2016    required progesterone supplementation during pregnancy    Past Surgical History:  Procedure Laterality Date  . NO PAST SURGERIES      Family History  Problem Relation Age of Onset  . Diabetes Paternal Grandmother   . Cancer Neg Hx   . Heart disease Neg Hx     Social History   Social History  . Marital status: Single    Spouse name: N/A  . Number of children: N/A  . Years of education: N/A   Occupational History  . Not on file.   Social History Main Topics  . Smoking status: Never Smoker  . Smokeless tobacco: Never Used  . Alcohol use No  . Drug use: No  . Sexual activity: Yes    Birth control/ protection: Pill   Other Topics Concern  . Not on file   Social History Narrative  . No narrative on file    Current Outpatient Prescriptions on  File Prior to Visit  Medication Sig Dispense Refill  . desogestrel-ethinyl estradiol (APRI,EMOQUETTE,SOLIA) 0.15-30 MG-MCG tablet Take 1 tablet by mouth daily. 3 Package 3   No current facility-administered medications on file prior to visit.     Allergies  Allergen Reactions  . Septra Ds [Sulfamethoxazole-Trimethoprim]      Review of Systems Constitutional: negative for chills, fatigue, fevers and sweats Eyes: negative for irritation, redness and visual disturbance Ears, nose, mouth, throat, and face: negative for hearing loss, nasal congestion, snoring and tinnitus Respiratory: negative for asthma, cough, sputum Cardiovascular: negative for chest pain, dyspnea, exertional chest pressure/discomfort, irregular heart beat, palpitations and syncope Gastrointestinal: negative for abdominal pain, change in bowel habits, nausea and vomiting Genitourinary: negative for abnormal menstrual periods, genital lesions, sexual problems and vaginal discharge, dysuria and urinary incontinence Integument/breast:  negative for breast lump, tenderness and nipple discharge.  Hematologic/lymphatic: negative for bleeding and easy bruising Musculoskeletal:negative for back pain and muscle weakness Neurological: negative for dizziness, headaches, vertigo and weakness Endocrine: negative for diabetic symptoms including polydipsia, polyuria and skin dryness Allergic/Immunologic: negative for hay fever and urticaria  Objective:  Blood pressure 120/68, pulse 89, height 5\' 4"  (1.626 m), weight 131 lb 4.8 oz (59.6 kg), last menstrual period 09/06/2016, not currently breastfeeding. Body mass index is 22.54 kg/m.  General Appearance:    Alert, cooperative, no distress, appears stated age  Head:    Normocephalic, without obvious abnormality, atraumatic  Eyes:    PERRL, conjunctiva/corneas clear, EOM's intact, both eyes  Ears:    Normal external ear canals, both ears  Nose:   Nares normal, septum midline,  mucosa normal, no drainage or sinus tenderness  Throat:   Lips, mucosa, and tongue normal; teeth and gums normal  Neck:   Supple, symmetrical, trachea midline, no adenopathy; thyroid: no enlargement/tenderness/nodules; no carotid bruit or JVD  Back:     Symmetric, no curvature, ROM normal, no CVA tenderness  Lungs:     Clear to auscultation bilaterally, respirations unlabored  Chest Wall:    No tenderness or deformity   Heart:    Regular rate and rhythm, S1 and S2 normal, no murmur, rub or gallop  Breast Exam:    Mo breast tenderness, no rash noted, no masses, or nipple abnormality  Abdomen:     Soft, non-tender, bowel sounds active all four quadrants, no masses, no organomegaly.    Genitalia:    Pelvic:external genitalia normal, vagina without lesions, discharge, or tenderness, rectovaginal septum  normal. Cervix normal in appearance, no cervical motion tenderness, no adnexal masses or tenderness.  Uterus normal size, shape, mobile, regular contours, nontender.  Rectal:    Normal external sphincter.  No hemorrhoids appreciated. Internal exam not done.   Extremities:   Extremities normal, atraumatic, no cyanosis or edema  Pulses:   2+ and symmetric all extremities  Skin:   Skin color, texture, turgor normal, no rashes or lesions  Lymph nodes:   Cervical, supraclavicular, and axillary nodes normal  Neurologic:   CNII-XII intact, normal strength, sensation and reflexes throughout    Labs:  Lab Results  Component Value Date   WBC 6.3 09/19/2015   HGB 11.6 (L) 09/19/2015   HCT 34.8 09/19/2015   MCV 86 09/19/2015   PLT 266 09/19/2015    Lab Results  Component Value Date   TSH 1.365 01/03/2009     Assessment:   Healthy female exam.  Normal weight  Plan:    Blood tests: None ordered.  Breast self exam technique reviewed and patient encouraged to perform self-exam monthly. Contraception: combined OCPs. Discussed healthy lifestyle modifications. Pap smear up to performed today.    To follow up in 1 year.    [name], MD [facility]

## 2016-09-23 NOTE — Patient Instructions (Addendum)

## 2016-09-25 LAB — PAP IG, CT-NG, RFX HPV ASCU
Chlamydia, Nuc. Acid Amp: NEGATIVE
GONOCOCCUS BY NUCLEIC ACID AMP: NEGATIVE
PAP Smear Comment: 0

## 2017-04-03 ENCOUNTER — Other Ambulatory Visit: Payer: Self-pay | Admitting: Family Medicine

## 2017-07-31 DIAGNOSIS — Z0000 Encounter for general adult medical examination without abnormal findings: Secondary | ICD-10-CM | POA: Diagnosis not present

## 2017-07-31 DIAGNOSIS — R5383 Other fatigue: Secondary | ICD-10-CM | POA: Diagnosis not present

## 2017-07-31 DIAGNOSIS — Z13220 Encounter for screening for lipoid disorders: Secondary | ICD-10-CM | POA: Diagnosis not present

## 2017-08-14 DIAGNOSIS — B373 Candidiasis of vulva and vagina: Secondary | ICD-10-CM | POA: Diagnosis not present

## 2017-08-14 DIAGNOSIS — Z6822 Body mass index (BMI) 22.0-22.9, adult: Secondary | ICD-10-CM | POA: Diagnosis not present

## 2017-08-14 DIAGNOSIS — Z01419 Encounter for gynecological examination (general) (routine) without abnormal findings: Secondary | ICD-10-CM | POA: Diagnosis not present

## 2017-09-23 ENCOUNTER — Encounter: Payer: BLUE CROSS/BLUE SHIELD | Admitting: Obstetrics and Gynecology

## 2017-11-26 DIAGNOSIS — Z3201 Encounter for pregnancy test, result positive: Secondary | ICD-10-CM | POA: Diagnosis not present

## 2017-12-18 DIAGNOSIS — Z3201 Encounter for pregnancy test, result positive: Secondary | ICD-10-CM | POA: Diagnosis not present

## 2017-12-18 DIAGNOSIS — Z3401 Encounter for supervision of normal first pregnancy, first trimester: Secondary | ICD-10-CM | POA: Diagnosis not present

## 2017-12-18 DIAGNOSIS — Z3400 Encounter for supervision of normal first pregnancy, unspecified trimester: Secondary | ICD-10-CM | POA: Diagnosis not present

## 2018-01-11 ENCOUNTER — Observation Stay (HOSPITAL_COMMUNITY)
Admission: AD | Admit: 2018-01-11 | Discharge: 2018-01-12 | Disposition: A | Discharge status: Home / Self Care | Payer: 59 | Source: Ambulatory Visit | Attending: Obstetrics and Gynecology | Admitting: Obstetrics and Gynecology

## 2018-01-11 ENCOUNTER — Inpatient Hospital Stay (HOSPITAL_COMMUNITY): Payer: 59 | Admitting: Anesthesiology

## 2018-01-11 ENCOUNTER — Encounter (HOSPITAL_COMMUNITY)
Admission: AD | Disposition: A | Discharge status: Home / Self Care | Payer: Self-pay | Source: Ambulatory Visit | Attending: Obstetrics and Gynecology

## 2018-01-11 ENCOUNTER — Other Ambulatory Visit: Payer: Self-pay

## 2018-01-11 ENCOUNTER — Encounter (HOSPITAL_COMMUNITY): Payer: Self-pay

## 2018-01-11 DIAGNOSIS — O00101 Right tubal pregnancy without intrauterine pregnancy: Secondary | ICD-10-CM | POA: Diagnosis not present

## 2018-01-11 DIAGNOSIS — N809 Endometriosis, unspecified: Secondary | ICD-10-CM | POA: Diagnosis not present

## 2018-01-11 DIAGNOSIS — O00109 Unspecified tubal pregnancy without intrauterine pregnancy: Secondary | ICD-10-CM | POA: Diagnosis present

## 2018-01-11 DIAGNOSIS — O009 Unspecified ectopic pregnancy without intrauterine pregnancy: Secondary | ICD-10-CM | POA: Diagnosis not present

## 2018-01-11 DIAGNOSIS — Z79899 Other long term (current) drug therapy: Secondary | ICD-10-CM | POA: Insufficient documentation

## 2018-01-11 DIAGNOSIS — N803 Endometriosis of pelvic peritoneum: Secondary | ICD-10-CM | POA: Insufficient documentation

## 2018-01-11 DIAGNOSIS — O209 Hemorrhage in early pregnancy, unspecified: Secondary | ICD-10-CM | POA: Diagnosis not present

## 2018-01-11 DIAGNOSIS — N736 Female pelvic peritoneal adhesions (postinfective): Secondary | ICD-10-CM | POA: Insufficient documentation

## 2018-01-11 HISTORY — PX: DIAGNOSTIC LAPAROSCOPY WITH REMOVAL OF ECTOPIC PREGNANCY: SHX6449

## 2018-01-11 LAB — CBC
HCT: 24.3 % — ABNORMAL LOW (ref 36.0–46.0)
HEMATOCRIT: 37.5 % (ref 36.0–46.0)
Hemoglobin: 12.7 g/dL (ref 12.0–15.0)
Hemoglobin: 8.4 g/dL — ABNORMAL LOW (ref 12.0–15.0)
MCH: 28.9 pg (ref 26.0–34.0)
MCH: 29.4 pg (ref 26.0–34.0)
MCHC: 33.9 g/dL (ref 30.0–36.0)
MCHC: 34.6 g/dL (ref 30.0–36.0)
MCV: 85.2 fL (ref 78.0–100.0)
MCV: 85 fL (ref 78.0–100.0)
PLATELETS: 276 10*3/uL (ref 150–400)
PLATELETS: 181 10*3/uL (ref 150–400)
RBC: 4.4 MIL/uL (ref 3.87–5.11)
RBC: 2.86 MIL/uL — AB (ref 3.87–5.11)
RDW: 13.7 % (ref 11.5–15.5)
RDW: 13.5 % (ref 11.5–15.5)
WBC: 4.5 10*3/uL (ref 4.0–10.5)
WBC: 10.8 10*3/uL — AB (ref 4.0–10.5)

## 2018-01-11 LAB — URINALYSIS, ROUTINE W REFLEX MICROSCOPIC
BILIRUBIN URINE: NEGATIVE
Glucose, UA: NEGATIVE mg/dL
KETONES UR: NEGATIVE mg/dL
LEUKOCYTES UA: NEGATIVE
Nitrite: NEGATIVE
PH: 6 (ref 5.0–8.0)
Protein, ur: NEGATIVE mg/dL
Specific Gravity, Urine: 1.005 (ref 1.005–1.030)

## 2018-01-11 LAB — ABO/RH: ABO/RH(D): B NEG

## 2018-01-11 LAB — HCG, QUANTITATIVE, PREGNANCY: hCG, Beta Chain, Quant, S: 28284 m[IU]/mL — ABNORMAL HIGH (ref <5)

## 2018-01-11 LAB — PREPARE RBC (CROSSMATCH)

## 2018-01-11 SURGERY — LAPAROSCOPY, WITH ECTOPIC PREGNANCY SURGICAL TREATMENT
Anesthesia: General | Laterality: Right

## 2018-01-11 MED ORDER — PHENYLEPHRINE 40 MCG/ML (10ML) SYRINGE FOR IV PUSH (FOR BLOOD PRESSURE SUPPORT)
PREFILLED_SYRINGE | INTRAVENOUS | Status: AC
  Filled 2018-01-11: qty 10

## 2018-01-11 MED ORDER — ACETAMINOPHEN 325 MG PO TABS: 650.0000 mg | ORAL_TABLET | ORAL | Status: DC | PRN

## 2018-01-11 MED ORDER — ALBUMIN HUMAN 5 % IV SOLN
INTRAVENOUS | Status: DC | PRN
  Administered 2018-01-11: 19:00:00 via INTRAVENOUS

## 2018-01-11 MED ORDER — RHO D IMMUNE GLOBULIN 1500 UNIT/2ML IJ SOSY
300.0000 ug | PREFILLED_SYRINGE | Freq: Once | INTRAMUSCULAR | Status: AC
  Administered 2018-01-11: 300 ug via INTRAMUSCULAR
  Filled 2018-01-11: qty 2

## 2018-01-11 MED ORDER — SCOPOLAMINE 1 MG/3DAYS TD PT72
MEDICATED_PATCH | TRANSDERMAL | Status: DC | PRN
  Administered 2018-01-11: 1 via TRANSDERMAL

## 2018-01-11 MED ORDER — CEFAZOLIN SODIUM-DEXTROSE 2-4 GM/100ML-% IV SOLN
2.0000 g | INTRAVENOUS | Status: AC
  Administered 2018-01-11: 2 g via INTRAVENOUS

## 2018-01-11 MED ORDER — PROPOFOL 10 MG/ML IV BOLUS
INTRAVENOUS | Status: DC | PRN
  Administered 2018-01-11: 150 mg via INTRAVENOUS

## 2018-01-11 MED ORDER — SODIUM CHLORIDE 0.9 % IR SOLN
Status: DC | PRN
  Administered 2018-01-11: 3000 mL

## 2018-01-11 MED ORDER — SIMETHICONE 80 MG PO CHEW
80.0000 mg | CHEWABLE_TABLET | Freq: Four times a day (QID) | ORAL | Status: DC | PRN
  Administered 2018-01-12: 80 mg via ORAL
  Filled 2018-01-11: qty 1

## 2018-01-11 MED ORDER — ALBUMIN HUMAN 5 % IV SOLN
INTRAVENOUS | Status: AC
  Filled 2018-01-11: qty 250

## 2018-01-11 MED ORDER — BUPIVACAINE HCL (PF) 0.25 % IJ SOLN
INTRAMUSCULAR | Status: DC | PRN
  Administered 2018-01-11 (×2): 10 mL

## 2018-01-11 MED ORDER — CEFAZOLIN SODIUM-DEXTROSE 2-4 GM/100ML-% IV SOLN
INTRAVENOUS | Status: AC
  Filled 2018-01-11: qty 100

## 2018-01-11 MED ORDER — ONDANSETRON HCL 4 MG/2ML IJ SOLN: 4.0000 mg | Freq: Four times a day (QID) | INTRAMUSCULAR | Status: DC | PRN

## 2018-01-11 MED ORDER — MIDAZOLAM HCL 2 MG/2ML IJ SOLN
INTRAMUSCULAR | Status: AC
  Filled 2018-01-11: qty 2

## 2018-01-11 MED ORDER — PROPOFOL 10 MG/ML IV BOLUS
INTRAVENOUS | Status: AC
  Filled 2018-01-11: qty 20

## 2018-01-11 MED ORDER — ONDANSETRON HCL 4 MG/2ML IJ SOLN
INTRAMUSCULAR | Status: DC | PRN
  Administered 2018-01-11: 4 mg via INTRAVENOUS

## 2018-01-11 MED ORDER — MEPERIDINE HCL 25 MG/ML IJ SOLN
INTRAMUSCULAR | Status: AC
  Filled 2018-01-11: qty 1

## 2018-01-11 MED ORDER — SUGAMMADEX SODIUM 200 MG/2ML IV SOLN
INTRAVENOUS | Status: AC
  Filled 2018-01-11: qty 2

## 2018-01-11 MED ORDER — FENTANYL CITRATE (PF) 100 MCG/2ML IJ SOLN
INTRAMUSCULAR | Status: DC | PRN
  Administered 2018-01-11: 50 ug via INTRAVENOUS
  Administered 2018-01-11: 100 ug via INTRAVENOUS
  Administered 2018-01-11 (×2): 50 ug via INTRAVENOUS

## 2018-01-11 MED ORDER — HYDROMORPHONE HCL 1 MG/ML IJ SOLN
0.2000 mg | INTRAMUSCULAR | Status: DC | PRN
  Administered 2018-01-11 – 2018-01-12 (×3): 0.2 mg via INTRAVENOUS
  Filled 2018-01-11 (×3): qty 1

## 2018-01-11 MED ORDER — GLYCOPYRROLATE 0.2 MG/ML IJ SOLN
INTRAMUSCULAR | Status: DC | PRN
  Administered 2018-01-11: 0.1 mg via INTRAVENOUS

## 2018-01-11 MED ORDER — LIDOCAINE HCL (CARDIAC) PF 100 MG/5ML IV SOSY
PREFILLED_SYRINGE | INTRAVENOUS | Status: DC | PRN
  Administered 2018-01-11: 100 mg via INTRAVENOUS

## 2018-01-11 MED ORDER — MIDAZOLAM HCL 2 MG/2ML IJ SOLN
INTRAMUSCULAR | Status: DC | PRN
  Administered 2018-01-11 (×2): 1 mg via INTRAVENOUS

## 2018-01-11 MED ORDER — LACTATED RINGERS IV SOLN
INTRAVENOUS | Status: DC
  Administered 2018-01-11 (×4): via INTRAVENOUS

## 2018-01-11 MED ORDER — ROCURONIUM BROMIDE 100 MG/10ML IV SOLN
INTRAVENOUS | Status: AC
  Filled 2018-01-11: qty 1

## 2018-01-11 MED ORDER — DEXAMETHASONE SODIUM PHOSPHATE 10 MG/ML IJ SOLN
INTRAMUSCULAR | Status: AC
  Filled 2018-01-11: qty 1

## 2018-01-11 MED ORDER — ONDANSETRON HCL 4 MG/2ML IJ SOLN
INTRAMUSCULAR | Status: AC
  Filled 2018-01-11: qty 2

## 2018-01-11 MED ORDER — ROCURONIUM BROMIDE 100 MG/10ML IV SOLN
INTRAVENOUS | Status: DC | PRN
  Administered 2018-01-11: 45 mg via INTRAVENOUS

## 2018-01-11 MED ORDER — LIDOCAINE HCL (CARDIAC) PF 100 MG/5ML IV SOSY
PREFILLED_SYRINGE | INTRAVENOUS | Status: AC
  Filled 2018-01-11: qty 5

## 2018-01-11 MED ORDER — METOCLOPRAMIDE HCL 5 MG/ML IJ SOLN
INTRAMUSCULAR | Status: DC | PRN
  Administered 2018-01-11: 10 mg via INTRAVENOUS

## 2018-01-11 MED ORDER — PHENYLEPHRINE HCL 10 MG/ML IJ SOLN
INTRAMUSCULAR | Status: DC | PRN
  Administered 2018-01-11: 40 ug via INTRAVENOUS
  Administered 2018-01-11 (×5): 80 ug via INTRAVENOUS

## 2018-01-11 MED ORDER — ONDANSETRON HCL 4 MG PO TABS: 4.0000 mg | ORAL_TABLET | Freq: Four times a day (QID) | ORAL | Status: DC | PRN

## 2018-01-11 MED ORDER — FENTANYL CITRATE (PF) 100 MCG/2ML IJ SOLN: 25.0000 ug | INTRAMUSCULAR | Status: DC | PRN

## 2018-01-11 MED ORDER — FENTANYL CITRATE (PF) 250 MCG/5ML IJ SOLN
INTRAMUSCULAR | Status: AC
  Filled 2018-01-11: qty 5

## 2018-01-11 MED ORDER — OXYCODONE-ACETAMINOPHEN 5-325 MG PO TABS
1.0000 | ORAL_TABLET | ORAL | Status: DC | PRN
  Administered 2018-01-12: 1 via ORAL

## 2018-01-11 MED ORDER — ALUM & MAG HYDROXIDE-SIMETH 200-200-20 MG/5ML PO SUSP: 30.0000 mL | ORAL | Status: DC | PRN

## 2018-01-11 MED ORDER — MEPERIDINE HCL 25 MG/ML IJ SOLN
6.2500 mg | INTRAMUSCULAR | Status: DC | PRN
  Administered 2018-01-11 (×2): 6.25 mg via INTRAVENOUS

## 2018-01-11 MED ORDER — LACTATED RINGERS IV SOLN
INTRAVENOUS | Status: DC
  Administered 2018-01-12: 03:00:00 via INTRAVENOUS

## 2018-01-11 MED ORDER — OXYCODONE-ACETAMINOPHEN 5-325 MG PO TABS
2.0000 | ORAL_TABLET | ORAL | Status: DC | PRN
  Filled 2018-01-11: qty 2

## 2018-01-11 MED ORDER — DEXAMETHASONE SODIUM PHOSPHATE 10 MG/ML IJ SOLN
INTRAMUSCULAR | Status: DC | PRN
  Administered 2018-01-11: 10 mg via INTRAVENOUS

## 2018-01-11 MED ORDER — SUGAMMADEX SODIUM 200 MG/2ML IV SOLN
INTRAVENOUS | Status: DC | PRN
  Administered 2018-01-11: 140 mg via INTRAVENOUS

## 2018-01-11 MED ORDER — 0.9 % SODIUM CHLORIDE (POUR BTL) OPTIME
TOPICAL | Status: DC | PRN
  Administered 2018-01-11: 1000 mL

## 2018-01-11 SURGICAL SUPPLY — 26 items
APPLICATOR SURGIFLO (MISCELLANEOUS) ×3 IMPLANT
DERMABOND ADVANCED (GAUZE/BANDAGES/DRESSINGS) ×2
DERMABOND ADVANCED .7 DNX12 (GAUZE/BANDAGES/DRESSINGS) ×1 IMPLANT
DRSG OPSITE POSTOP 3X4 (GAUZE/BANDAGES/DRESSINGS) ×3 IMPLANT
DURAPREP 26ML APPLICATOR (WOUND CARE) ×3 IMPLANT
FLOSEAL 10ML (HEMOSTASIS) ×3 IMPLANT
GLOVE BIO SURGEON STRL SZ7 (GLOVE) ×3 IMPLANT
GLOVE BIOGEL PI IND STRL 7.0 (GLOVE) ×4 IMPLANT
GLOVE BIOGEL PI INDICATOR 7.0 (GLOVE) ×8
GOWN STRL REUS W/TWL LRG LVL3 (GOWN DISPOSABLE) ×9 IMPLANT
PACK LAPAROSCOPY BASIN (CUSTOM PROCEDURE TRAY) ×3 IMPLANT
PACK TRENDGUARD 450 HYBRID PRO (MISCELLANEOUS) ×1 IMPLANT
POUCH SPECIMEN RETRIEVAL 10MM (ENDOMECHANICALS) ×3 IMPLANT
PROTECTOR NERVE ULNAR (MISCELLANEOUS) ×6 IMPLANT
SET IRRIG TUBING LAPAROSCOPIC (IRRIGATION / IRRIGATOR) ×3 IMPLANT
SHEARS HARMONIC ACE PLUS 36CM (ENDOMECHANICALS) ×3 IMPLANT
SLEEVE XCEL OPT CAN 5 100 (ENDOMECHANICALS) ×3 IMPLANT
SUT MNCRL AB 4-0 PS2 18 (SUTURE) ×3 IMPLANT
SUT VICRYL 0 UR6 27IN ABS (SUTURE) ×3 IMPLANT
TOWEL OR 17X24 6PK STRL BLUE (TOWEL DISPOSABLE) ×6 IMPLANT
TRAY FOLEY W/BAG SLVR 14FR (SET/KITS/TRAYS/PACK) ×3 IMPLANT
TRENDGUARD 450 HYBRID PRO PACK (MISCELLANEOUS) ×3
TROCAR XCEL NON-BLD 11X100MML (ENDOMECHANICALS) ×3 IMPLANT
TROCAR XCEL NON-BLD 5MMX100MML (ENDOMECHANICALS) ×6 IMPLANT
TUBING INSUF HEATED (TUBING) ×3 IMPLANT
WARMER LAPAROSCOPE (MISCELLANEOUS) ×3 IMPLANT

## 2018-01-11 NOTE — Brief Op Note (Signed)
01/11/2018  7:54 PM  PATIENT:  Mercedes Patel  27 y.o. female  PRE-OPERATIVE DIAGNOSIS:  Right ECTOPIC PREGNANCY  POST-OPERATIVE DIAGNOSIS:  Large ECTOPIC PREGNANCY, Endometriosis, Pelvic adhesions  PROCEDURE:  Procedure(s): RIGHT SALPINGECTOMY WITH REMOVAL OF ECTOPIC PREGNANCY (Right), Operative laparoscopy  SURGEON:  Surgeon(s) and Role:    Thurnell Lose, MD - Primary    * Janyth Pupa, DO - Assisting  PHYSICIAN ASSISTANT: None   ASSISTANTS: Dr. Janyth Pupa   ANESTHESIA:   local and general  EBL:  650 mL   BLOOD ADMINISTERED:none  DRAINS: Urinary Catheter (Foley)   LOCAL MEDICATIONS USED:  0.25% MARCAINE     SPECIMEN:  Source of Specimen:  right ectopic pregnancy, right fallopian tube  DISPOSITION OF SPECIMEN:  PATHOLOGY  COUNTS:  YES  TOURNIQUET:  * No tourniquets in log *  DICTATION: .Other Dictation: Dictation Number [id number]  PLAN OF CARE: Admit for overnight observation  PATIENT DISPOSITION:  PACU - hemodynamically stable.   Delay start of Pharmacological VTE agent (>24hrs) due to surgical blood loss or risk of bleeding: yes

## 2018-01-11 NOTE — OR Nursing (Signed)
Called for patient to be brought to OR desk from MAU.

## 2018-01-11 NOTE — Anesthesia Procedure Notes (Signed)
Procedure Name: Intubation Date/Time: 01/11/2018 5:43 PM Performed by: Flossie Dibble, CRNA Pre-anesthesia Checklist: Patient identified, Patient being monitored, Timeout performed, Emergency Drugs available and Suction available Patient Re-evaluated:Patient Re-evaluated prior to induction Oxygen Delivery Method: Circle System Utilized Preoxygenation: Pre-oxygenation with 100% oxygen Induction Type: IV induction Ventilation: Mask ventilation without difficulty Laryngoscope Size: Mac and 3 Grade View: Grade I Tube type: Oral Tube size: 7.0 mm Number of attempts: 1 Airway Equipment and Method: stylet Placement Confirmation: ETT inserted through vocal cords under direct vision,  positive ETCO2 and breath sounds checked- equal and bilateral Secured at: 20.5 cm Tube secured with: Tape Dental Injury: Teeth and Oropharynx as per pre-operative assessment

## 2018-01-11 NOTE — MAU Note (Signed)
Pt presents to MAU from OB office due to vaginal bleeding during pregnancy. Pt here for ectopic work up.

## 2018-01-11 NOTE — Plan of Care (Signed)
  Problem: Education: Goal: Knowledge of the prescribed therapeutic regimen will improve Outcome: Progressing   Problem: Pain Managment: Goal: General experience of comfort will improve Outcome: Progressing   Problem: Safety: Goal: Ability to remain free from injury will improve Outcome: Progressing   Problem: Skin Integrity: Goal: Risk for impaired skin integrity will decrease Outcome: Progressing

## 2018-01-11 NOTE — Transfer of Care (Signed)
Immediate Anesthesia Transfer of Care Note  Patient: Mercedes Patel  Procedure(s) Performed: RIGHT SALPINGECTOMY WITH REMOVAL OF ECTOPIC PREGNANCY (Right )  Patient Location: PACU  Anesthesia Type:General  Level of Consciousness: awake, alert  and oriented  Airway & Oxygen Therapy: Patient Spontanous Breathing and Patient connected to nasal cannula oxygen  Post-op Assessment: Report given to RN and Post -op Vital signs reviewed and stable  Post vital signs: Reviewed and stable  Last Vitals:  Vitals Value Taken Time  BP 102/63 01/11/2018  7:48 PM  Temp    Pulse 101 01/11/2018  7:50 PM  Resp 14 01/11/2018  7:50 PM  SpO2 100 % 01/11/2018  7:50 PM  Vitals shown include unvalidated device data.  Last Pain: There were no vitals filed for this visit.       Complications: No apparent anesthesia complications

## 2018-01-11 NOTE — H&P (Signed)
Mercedes Patel is an 27 y.o. female G2 P0010 @ 11+ weeks by a sure LMP.being admitted for suspected ectopic pregnancy. Last Wed/Thurs Spotting/Brown discharge.  Friday-BRB but intermittent with brown discharge.  Yesterday, Sunday, she had 2 dime sized blood clots, not heavy enough to soak a pad.  No bleeding heavy like a period since this started.  Not in any pain, cramping.  Pt denies abdominal pain or pelvic pain at any time.  Pertinent Gynecological History: Menses: Not too heavy, mild cramping-takes Ibuprofen prn   OB History: G2, 0010 Vaginal delivery at term.  H/o short cervix.   Menstrual History:  Patient's last menstrual period was 10/23/2017 (exact date).  Pt is sure about LMP.  Attempting conception since October.  Past Medical History:  Diagnosis Date  . Anemia   . Medical history non-contributory   . Short cervical length during pregnancy 2016   required progesterone supplementation during pregnancy    Past Surgical History:  Procedure Laterality Date  . NO PAST SURGERIES      Family History  Problem Relation Age of Onset  . Diabetes Paternal Grandmother   . Cancer Neg Hx   . Heart disease Neg Hx     Social History:  reports that she has never smoked. She has never used smokeless tobacco. She reports that she does not drink alcohol or use drugs.  Allergies:  Allergies  Allergen Reactions  . Septra Ds [Sulfamethoxazole-Trimethoprim] Hives    Medications Prior to Admission  Medication Sig Dispense Refill Last Dose  . acetaminophen (TYLENOL) 500 MG tablet Take 1,000 mg by mouth every 6 (six) hours as needed for mild pain or headache.    Past Month at Unknown time  . Prenatal MV & Min w/FA-DHA (PRENATAL ADULT GUMMY/DHA/FA PO) Take 2 each by mouth daily.   01/10/2018 at Unknown time  . APRI 0.15-30 MG-MCG tablet TAKE 1 TABLET BY MOUTH EVERY DAY (Patient not taking: Reported on 01/11/2018) 84 tablet 3 Not Taking at Unknown time    Review of Systems   Constitutional: Negative for chills and fever.  Respiratory: Negative for shortness of breath.   Cardiovascular: Negative for chest pain.  Gastrointestinal: Negative for abdominal pain.  Neurological: Negative for dizziness.    Last menstrual period 10/23/2017. Physical Exam  Constitutional: She is oriented to person, place, and time. She appears well-developed and well-nourished. No distress.  HENT:  Head: Normocephalic and atraumatic.  Neck: Normal range of motion.  Cardiovascular: Normal rate and regular rhythm.  Respiratory: Effort normal and breath sounds normal. No respiratory distress.  GI: She exhibits no distension. There is no tenderness. There is no rebound and no guarding.  Musculoskeletal: Normal range of motion. She exhibits no edema or tenderness.  Neurological: She is alert and oriented to person, place, and time. She has normal reflexes.  Skin: Skin is warm and dry.  Psychiatric: She has a normal mood and affect.    Results for orders placed or performed during the hospital encounter of 01/11/18 (from the past 24 hour(s))  hCG, quantitative, pregnancy     Status: Abnormal   Collection Time: 01/11/18 12:14 PM  Result Value Ref Range   hCG, Beta Chain, Quant, S 28,284 (H) <5 mIU/mL  Type and screen [place]     Status: None (Preliminary result)   Collection Time: 01/11/18 12:14 PM  Result Value Ref Range   ABO/RH(D) B NEG    Antibody Screen NEG    Sample Expiration  01/14/2018 Performed at [facility], [address], [place], [place] [address]    Unit Number [id number]    Blood Component Type RED CELLS,LR    Unit division 00    Status of Unit ALLOCATED    Transfusion Status OK TO TRANSFUSE    Crossmatch Result Compatible    Unit Number [id number]    Blood Component Type RED CELLS,LR    Unit division 00    Status of Unit ALLOCATED    Transfusion Status OK TO TRANSFUSE    Crossmatch Result Compatible   CBC      Status: None   Collection Time: 01/11/18 12:14 PM  Result Value Ref Range   WBC 4.5 4.0 - 10.5 K/uL   RBC 4.40 3.87 - 5.11 MIL/uL   Hemoglobin 12.7 12.0 - 15.0 g/dL   HCT 37.5 36.0 - 46.0 %   MCV 85.2 78.0 - 100.0 fL   MCH 28.9 26.0 - 34.0 pg   MCHC 33.9 30.0 - 36.0 g/dL   RDW 13.7 11.5 - 15.5 %   Platelets 276 150 - 400 K/uL  ABO/Rh     Status: None   Collection Time: 01/11/18 12:14 PM  Result Value Ref Range   ABO/RH(D)      B NEG Performed at [facility], [address], [place], [place] [address]   Urinalysis, Routine w reflex microscopic     Status: Abnormal   Collection Time: 01/11/18 12:23 PM  Result Value Ref Range   Color, Urine STRAW (A) YELLOW   APPearance CLEAR CLEAR   Specific Gravity, Urine 1.005 1.005 - 1.030   pH 6.0 5.0 - 8.0   Glucose, UA NEGATIVE NEGATIVE mg/dL   Hgb urine dipstick LARGE (A) NEGATIVE   Bilirubin Urine NEGATIVE NEGATIVE   Ketones, ur NEGATIVE NEGATIVE mg/dL   Protein, ur NEGATIVE NEGATIVE mg/dL   Nitrite NEGATIVE NEGATIVE   Leukocytes, UA NEGATIVE NEGATIVE   RBC / HPF 21-50 0 - 5 RBC/hpf   WBC, UA 6-10 0 - 5 WBC/hpf   Bacteria, UA RARE (A) NONE SEEN   Squamous Epithelial / LPF 0-5 0 - 5   Mucus PRESENT   Prepare RBC     Status: None   Collection Time: 01/11/18  1:29 PM  Result Value Ref Range   Order Confirmation      ORDER PROCESSED BY BLOOD BANK Performed at [facility], [address] [place], [place] [address]     No results found.  Assessment/Plan: -Right ectopic pregnancy-Surprising asymptomatic, especially given its size.Suspect location may be atypical. -Proceed with Laparoscopy.  Consent for salpingectomy, salpingostomy, resection of ectopic pregnancy including wedge resection of corneal ectopic.  Pt counseled on possible laparotomy. -Type and match 2  Units.  Pt is not opposed to a blood transfusion. -Anticipate discharge home from PACU after surgery.  Thurnell Lose 01/11/2018, 2:46 PM

## 2018-01-11 NOTE — Discharge Instructions (Signed)
Ectopic Pregnancy An ectopic pregnancy is when the fertilized egg attaches (implants) outside the uterus. Most ectopic pregnancies occur in one of the tubes where eggs travel from the ovary to the uterus (fallopian tubes), but the implanting can occur in other locations. In rare cases, ectopic pregnancies occur on the ovary, intestine, pelvis, abdomen, or cervix. In an ectopic pregnancy, the fertilized egg does not have the ability to develop into a normal, healthy baby. A ruptured ectopic pregnancy is one in which tearing or bursting of a fallopian tube causes internal bleeding. Often, there is intense lower abdominal pain, and vaginal bleeding sometimes occurs. Having an ectopic pregnancy can be life-threatening. If this dangerous condition is not treated, it can lead to blood loss, shock, or even death. What are the causes? The most common cause of this condition is damage to one of the fallopian tubes. A fallopian tube may be narrowed or blocked, and that keeps the fertilized egg from reaching the uterus. What increases the risk? This condition is more likely to develop in women of childbearing age who have different levels of risk. The levels of risk can be divided into three categories. High risk  You have gone through infertility treatment.  You have had an ectopic pregnancy before.  You have had surgery on the fallopian tubes, or another surgical procedure, such as an abortion.  You have had surgery to have the fallopian tubes tied (tubal ligation).  You have problems or diseases of the fallopian tubes.  You have been exposed to diethylstilbestrol (DES). This medicine was used until 1971, and it had effects on babies whose mothers took the medicine.  You become pregnant while using an IUD (intrauterine device) for birth control. Moderate risk  You have a history of infertility.  You have had an STI (sexually transmitted infection).  You have a history of pelvic inflammatory  disease (PID).  You have scarring from endometriosis.  You have multiple sexual partners.  You smoke. Low risk  You have had pelvic surgery.  You use vaginal douches.  You became sexually active before age 47. What are the signs or symptoms? Common symptoms of this condition include normal pregnancy symptoms, such as missing a period, nausea, tiredness, abdominal pain, breast tenderness, and bleeding. However, ectopic pregnancy will have additional symptoms, such as:  Pain with intercourse.  Irregular vaginal bleeding or spotting.  Cramping or pain on one side or in the lower abdomen.  Fast heartbeat, low blood pressure, and sweating.  Passing out while having a bowel movement.  Symptoms of a ruptured ectopic pregnancy and internal bleeding may include:  Sudden, severe pain in the abdomen and pelvis.  Dizziness, weakness, light-headedness, or fainting.  Pain in the shoulder or neck area.  How is this diagnosed? This condition is diagnosed by:  A pelvic exam to locate pain or a mass in the abdomen.  A pregnancy test. This blood test checks for the presence as well as the specific level of pregnancy hormone in the bloodstream.  Ultrasound. This is performed if a pregnancy test is positive. In this test, a probe is inserted into the vagina. The probe will detect a fetus, possibly in a location other than the uterus.  Taking a sample of uterus tissue (dilation and curettage, or D&C).  Surgery to perform a visual exam of the inside of the abdomen using a thin, lighted tube that has a tiny camera on the end (laparoscope).  Culdocentesis. This procedure involves inserting a needle at the top  of the vagina, behind the uterus. If blood is present in this area, it may indicate that a fallopian tube is torn.  How is this treated? This condition is treated with medicine or surgery. Medicine  An injection of a medicine (methotrexate) may be given to cause the pregnancy tissue  to be absorbed. This medicine may save your fallopian tube. It may be given if: ? The diagnosis is made early, with no signs of active bleeding. ? The fallopian tube has not ruptured. ? You are considered to be a good candidate for the medicine. Usually, pregnancy hormone blood levels are checked after methotrexate treatment. This is to be sure that the medicine is effective. It may take 4-6 weeks for the pregnancy to be absorbed. Most pregnancies will be absorbed by 3 weeks. Surgery  A laparoscope may be used to remove the pregnancy tissue.  If severe internal bleeding occurs, a larger cut (incision) may be made in the lower abdomen (laparotomy) to remove the fetus and placenta. This is done to stop the bleeding.  Part or all of the fallopian tube may be removed (salpingectomy) along with the fetus and placenta. The fallopian tube may also be repaired during the surgery.  In very rare circumstances, removal of the uterus (hysterectomy) may be required.  After surgery, pregnancy hormone testing may be done to be sure that there is no pregnancy tissue left. Whether your treatment is medicine or surgery, you may receive a Rho (D) immune globulin shot to prevent problems with any future pregnancy. This shot may be given if:  You are Rh-negative and the baby's father is Rh-positive.  You are Rh-negative and you do not know the Rh type of the baby's father.  Follow these instructions at home:  Rest and limit your activity after the procedure for as long as told by your health care provider.  Until your health care provider says that it is safe: ? Do not lift anything that is heavier than 10 lb (4.5 kg), or the limit that your health care provider tells you. ? Avoid physical exercise and any movement that requires effort (is strenuous).  To help prevent constipation: ? Eat a healthy diet that includes fruits, vegetables, and whole grains. ? Drink 6-8 glasses of water per day. Get help  right away if:  You develop worsening pain that is not relieved by medicine.  You have: ? A fever or chills. ? Vaginal bleeding. ? Redness and swelling at the incision site. ? Nausea and vomiting.  You feel dizzy or weak.  You feel light-headed or you faint. This information is not intended to replace advice given to you by your health care provider. Make sure you discuss any questions you have with your health care provider. Document Released: 05/15/2004 Document Revised: 12/05/2015 Document Reviewed: 11/07/2015 Elsevier Interactive Patient Education  [employer].

## 2018-01-11 NOTE — Anesthesia Preprocedure Evaluation (Signed)
Anesthesia Evaluation  Patient identified by MRN, date of birth, ID band Patient awake    Reviewed: Allergy & Precautions, NPO status , Patient's Chart, lab work & pertinent test results  Airway Mallampati: II  TM Distance: >3 FB Neck ROM: Full    Dental no notable dental hx. (+) Teeth Intact, Dental Advisory Given   Pulmonary    Pulmonary exam normal breath sounds clear to auscultation       Cardiovascular Normal cardiovascular exam Rhythm:Regular Rate:Normal     Neuro/Psych negative neurological ROS  negative psych ROS   GI/Hepatic negative GI ROS, Neg liver ROS,   Endo/Other  negative endocrine ROS  Renal/GU negative Renal ROS  negative genitourinary   Musculoskeletal negative musculoskeletal ROS (+)   Abdominal   Peds  Hematology  (+) anemia ,   Anesthesia Other Findings Ectopic Pregnancy  Reproductive/Obstetrics                             Anesthesia Physical Anesthesia Plan  ASA: II  Anesthesia Plan: General   Post-op Pain Management:    Induction: Intravenous  PONV Risk Score and Plan: 3 and Dexamethasone, Ondansetron, Scopolamine patch - Pre-op and Midazolam  Airway Management Planned: Oral ETT  Additional Equipment:   Intra-op Plan:   Post-operative Plan: Extubation in OR  Informed Consent: I have reviewed the patients History and Physical, chart, labs and discussed the procedure including the risks, benefits and alternatives for the proposed anesthesia with the patient or authorized representative who has indicated his/her understanding and acceptance.   Dental advisory given  Plan Discussed with: CRNA  Anesthesia Plan Comments:         Anesthesia Quick Evaluation

## 2018-01-11 NOTE — Anesthesia Postprocedure Evaluation (Signed)
Anesthesia Post Note  Patient: Mercedes Patel  Procedure(s) Performed: RIGHT SALPINGECTOMY WITH REMOVAL OF ECTOPIC PREGNANCY (Right )     Patient location during evaluation: PACU Anesthesia Type: General Level of consciousness: sedated Pain management: pain level controlled Vital Signs Assessment: post-procedure vital signs reviewed and stable Respiratory status: spontaneous breathing, nonlabored ventilation, respiratory function stable and patient connected to nasal cannula oxygen Cardiovascular status: blood pressure returned to baseline and stable Postop Assessment: no apparent nausea or vomiting Anesthetic complications: no    Last Vitals:  Vitals:   01/11/18 1948  Temp: 36.7 C    Last Pain:  Vitals:   01/11/18 1948  PainSc: Asleep   Pain Goal:                 Lidia Collum

## 2018-01-12 ENCOUNTER — Encounter (HOSPITAL_COMMUNITY): Payer: Self-pay | Admitting: Obstetrics and Gynecology

## 2018-01-12 LAB — CBC
HCT: 21.6 % — ABNORMAL LOW (ref 36.0–46.0)
Hemoglobin: 7.5 g/dL — ABNORMAL LOW (ref 12.0–15.0)
MCH: 29.3 pg (ref 26.0–34.0)
MCHC: 34.7 g/dL (ref 30.0–36.0)
MCV: 84.4 fL (ref 78.0–100.0)
PLATELETS: 165 10*3/uL (ref 150–400)
RBC: 2.56 MIL/uL — AB (ref 3.87–5.11)
RDW: 13.5 % (ref 11.5–15.5)
WBC: 8.6 10*3/uL (ref 4.0–10.5)

## 2018-01-12 LAB — RH IG WORKUP (INCLUDES ABO/RH)
ABO/RH(D): B NEG
Gestational Age(Wks): 11
Unit division: 0

## 2018-01-12 MED ORDER — POLYSACCHARIDE IRON COMPLEX 150 MG PO CAPS
150.0000 mg | ORAL_CAPSULE | Freq: Two times a day (BID) | ORAL | Status: DC
  Administered 2018-01-12: 150 mg via ORAL
  Filled 2018-01-12 (×3): qty 1

## 2018-01-12 MED ORDER — IBUPROFEN 600 MG PO TABS: 600.0000 mg | ORAL_TABLET | Freq: Four times a day (QID) | ORAL | 0 refills | Status: AC | PRN

## 2018-01-12 MED ORDER — OXYCODONE-ACETAMINOPHEN 5-325 MG PO TABS: 1.0000 | ORAL_TABLET | ORAL | 0 refills | Status: AC | PRN

## 2018-01-12 NOTE — Addendum Note (Signed)
Addendum  created 01/12/18 0830 by Hewitt Blade, CRNA   Sign clinical note

## 2018-01-12 NOTE — Discharge Summary (Signed)
Physician Discharge Summary  Patient ID: Mercedes Patel MRN: [id number] DOB/AGE: 07-20-1990 27 y.o.  Admit date: 01/11/2018 Discharge date: 01/12/2018  Admission Diagnoses:  Ectopic pregnancy  Discharge Diagnoses:  Active Problems:   Ectopic pregnancy, tubal   Discharged Condition: good  Hospital Course: Pt kept overnight for observation due to blood loss. Pt with EBL 650 ml.  Hg ~7.5 post op day #1. Pt's VS were stable, UOP was good.  She denied dizziness with ambulation.  Consults: None  Significant Diagnostic Studies: None  CBC Latest Ref Rng & Units 01/12/2018 01/11/2018 01/11/2018  WBC 4.0 - 10.5 K/uL 8.6 10.8(H) 4.5  Hemoglobin 12.0 - 15.0 g/dL 7.5(L) 8.4(L) 12.7  Hematocrit 36.0 - 46.0 % 21.6(L) 24.3(L) 37.5  Platelets 150 - 400 K/uL 165 181 276    Treatments: surgery: See above.  Discharge Exam: Blood pressure (!) 114/56, pulse 81, temperature 98.1 F (36.7 C), temperature source Oral, resp. rate 18, last menstrual period 10/23/2017, SpO2 100 %, unknown if currently breastfeeding.  Gen:  NAD Abd:  Dressings clean and dry, soft, nontender Ext:  No edema or calf tenderness.  Disposition: Discharge disposition: 01-Home or Self Care       Discharge Instructions     Remove dressing in 72 hours   Complete by:  As directed    Call MD for:  persistant dizziness or light-headedness   Complete by:  As directed    Call MD for:  persistant nausea and vomiting   Complete by:  As directed    Call MD for:  redness, tenderness, or signs of infection (pain, swelling, redness, odor or green/yellow discharge around incision site)   Complete by:  As directed    Call MD for:  severe uncontrolled pain   Complete by:  As directed    Call MD for:  temperature >100.4   Complete by:  As directed    Diet - low sodium heart healthy   Complete by:  As directed    Increase activity slowly   Complete by:  As directed    Lifting restrictions   Complete by:  As directed    No  lifting/pushing/pulling greater than 15 pounds.   Sexual Activity Restrictions   Complete by:  As directed    Pelvic rest x 2 weeks.     Allergies as of 01/12/2018      Reactions   Septra Ds [sulfamethoxazole-trimethoprim] Hives      Medication List    TAKE these medications   acetaminophen 500 MG tablet Commonly known as:  TYLENOL Take 1,000 mg by mouth every 6 (six) hours as needed for mild pain or headache.   ibuprofen 600 MG tablet Commonly known as:  ADVIL,MOTRIN Take 1 tablet (600 mg total) by mouth every 6 (six) hours as needed.   oxyCODONE-acetaminophen 5-325 MG tablet Commonly known as:  PERCOCET/ROXICET Take 1 tablet by mouth every 4 (four) hours as needed for severe pain.   PRENATAL ADULT GUMMY/DHA/FA PO Take 2 each by mouth daily.      Follow-up Information    Thurnell Lose, MD. Schedule an appointment as soon as possible for a visit in 2 week(s).   Specialty:  Obstetrics and Gynecology Why:  Post op check up Contact information: [address] [place] [address] [phone]           Signed: Thurnell Lose 01/12/2018, 2:07 PM

## 2018-01-12 NOTE — Anesthesia Postprocedure Evaluation (Signed)
Anesthesia Post Note  Patient: Quinn Bartling  Procedure(s) Performed: RIGHT SALPINGECTOMY WITH REMOVAL OF ECTOPIC PREGNANCY (Right )     Patient location during evaluation: [facility] Anesthesia Type: General Level of consciousness: awake and alert Pain management: pain level controlled Vital Signs Assessment: post-procedure vital signs reviewed and stable Respiratory status: spontaneous breathing, nonlabored ventilation and respiratory function stable Cardiovascular status: stable Postop Assessment: no apparent nausea or vomiting Anesthetic complications: no    Last Vitals:  Vitals:   01/12/18 0241 01/12/18 0620  BP: (!) 111/55 118/63  Pulse: 82 72  Resp: 14 16  Temp:  36.8 C  SpO2: 98% 100%    Last Pain:  Vitals:   01/12/18 0730  TempSrc:   PainSc: 0-No pain   Pain Goal: Patients Stated Pain Goal: 4 (01/12/18 0248)               Jabier Mutton

## 2018-01-13 NOTE — Op Note (Signed)
Mercedes Patel, CORAL MEDICAL RECORD [id number] ACCOUNT [id number] DATE OF BIRTH:Aug 08, 1990 FACILITY: [facility] LOCATION: [place], [place]  OPERATIVE REPORT  DATE OF PROCEDURE:  01/11/2018  PREOPERATIVE DIAGNOSIS:  Right ectopic pregnancy.  POSTOPERATIVE DIAGNOSES:  Large ectopic pregnancy, questionable cornual ectopic, endometriosis and pelvic adhesions.  PROCEDURES:  Right salpingectomy with removal of ectopic pregnancy and operative laparoscopy, lysis of adhesions.  SURGEON:  Thurnell Lose, MD  ASSISTANT:  Dr. Janyth Pupa  ANESTHESIA:  Local and general.  ESTIMATED BLOOD LOSS:  650 mL  BLOOD ADMINISTERED:  None.  DRAINS:  Foley catheter.  Local was 0.25% Marcaine.  SPECIMENS:  Right ectopic pregnancy, right fallopian tube.  DISPOSITION OF SPECIMENS:  To pathology.  PATIENT DISPOSITION:  To PACU hemodynamically stable.  COMPLICATIONS:  None.  FINDINGS:  Large 6 cm ectopic that encompassed the broad ligament and the cornua of the uterus and the entire mesosalpinx.  Adhesions of the fallopian tube to the ovary bilaterally.  Fitz-Hugh-Curtis identified above the liver age; endometrial implant on  the round ligament on the left side.  Uterus normal.  DESCRIPTION OF PROCEDURE:  The patient was identified in the return of admission unit.  She was taken to the operating room.  She was placed in the dorsal supine position and underwent general anesthesia without complication.  She was placed in the  dorsal lithotomy position, prepped and draped in the normal sterile fashion.  Timeout was performed.  SCDs were on her legs and operating.  Ancef 2 grams IV was administered.  Bimanual was performed.  Anteverted uterus noted.  A single tooth tenaculum was used to grasp the anterior lip of the cervix after placing a Graves speculum in the vagina.  The uterine manipulator was then advanced through the uterus atraumatically.  Attention was turned to the  abdomen.  The umbilicus was injected with Marcaine and an 11 blade scalpel was used to incise five to make a 5 mm incision.  The laparoscope was then advanced through the abdomen under direct visualization.  The CO2 gas was  then used to insufflate the abdomen.  The findings above were noted.  Due to the size of the ectopic and the adhesions noted on that side.  There was a challenge to decide how to remove this safely without damaging major structures.  Our goal was to keep  the ovary.  Also the round ligament that supported the uterus was also kind of pulled into the ectopic, so the plan of attack was 2 and cut around it while sparing those structures.    The adhesed fallopian tube was released from under the ovarian fossa and once the fallopian tube was freed up, it was easier to visualize some of the anatomy.  The  mesosalpinx between the fallopian tube and the ovary was then transected and then we  separated the adhesions from the ectopic and the ovary from below.  Eventually, we would come around on top by the round ligament and kind of shell out the ectopic.  In doing so, we got into the ectopic and again because it was so large it was very  vascular.  Bleeding was noted.  We used the Kleppinger for hemostasis.  Eventually, we came around to remove the ectopic from the right side of the uterus.  Bleeding was noted.  I do not believe we got into the cornua of the uterus or the uterine artery,  but there was bleeding from that edge.  The Kleppinger was used as much as  we could.  FloSeal was then called for  because of the excessive bleeding and inability to cauterize with the Kleppinger.  I was eventually able to use the Harmonic which I did  use the Harmonic to excise the ectopic.  The Harmonic was then used on the right side of the uterus and that seemed to slow the bleeding significantly.  FloSeal was then applied to the bed where the ectopic was resected.  Prior to putting on the FloSeal,  I  released the CO2 gas to see if there was any bleeding from that area and there was not.  I put the FloSeal on and did the same thing and there was no active bleeding.  It was somewhat oozing, but there was no active bleeding.  The remaining fallopian tube and ovary appeared to be scarred and adhesed as well.  The fimbria were adhered to the ovary.  The upper right quadrant revealed Fitz-Hugh-Curtis but no other masses.  The appendix also appeared to somewhat be scarred down to  the peritoneum.  Also during the uterine manipulation, while we were removing the ectopic, the Hulka bulb appeared at the lower uterine segment.  It did not perforate entirely, but we did see the impression of the manipulator.  There was no bleeding at that site.  The abdomen was copiously irrigated.  All clots were removed.  The trocars were then removed under direct visualization.  CO2 gas was expelled with inspiratory breath.  We inserted two 5 mm trocars, one at the umbilicus and 1 in the left lower quadrant.  They were all inserted in a similar fashion with 0.25% Marcaine and under direct visualization.  The right lower quadrant had a 10 mm trocar, because there was a large  inferior epigastric on the left hand side.  So we put the 10 mm in the right hand side.  The EndoCatch bag was used to collect the ectopic and fallopian tube.  That was brought to the surface of the abdomen and it came out without having to extend the  fascia.  The fascial incision was then reapproximated with 0 Vicryl in a continuous fashion.  The subcutaneous tissue was then reapproximated with 4-0 Monocryl on the umbilical and left lower quadrant port.  The longer right lower quadrant incision was then  reapproximated in a subcuticular fashion with 4-0 Monocryl.  Dermabond was applied to the incisions.  Attention was turned to the vagina.  The Hulka manipulator was then removed and the speculum when she was placed to make sure that there was no  active bleeding in the cervix appeared normal.  All instrument, sponge and needle counts were correct x3.  The patient tolerated the procedure well.  She went to the recovery room in stable condition.  She did not show any significant signs of hypovolemia intraoperatively.  AN/NUANCE  D:01/13/2018 T:01/13/2018 JOB:[id number]

## 2018-01-15 LAB — BPAM RBC
Blood Product Expiration Date: 201910162359
Blood Product Expiration Date: 201910202359
UNIT TYPE AND RH: 9500
Unit Type and Rh: 9500

## 2018-01-15 LAB — TYPE AND SCREEN
ABO/RH(D): B NEG
ANTIBODY SCREEN: NEGATIVE
UNIT DIVISION: 0
UNIT DIVISION: 0

## 2018-01-25 DIAGNOSIS — Z3202 Encounter for pregnancy test, result negative: Secondary | ICD-10-CM | POA: Diagnosis not present

## 2018-01-28 DIAGNOSIS — Z23 Encounter for immunization: Secondary | ICD-10-CM | POA: Diagnosis not present

## 2018-02-10 ENCOUNTER — Other Ambulatory Visit: Payer: Self-pay | Admitting: Obstetrics and Gynecology

## 2018-02-10 DIAGNOSIS — N736 Female pelvic peritoneal adhesions (postinfective): Secondary | ICD-10-CM

## 2018-02-10 DIAGNOSIS — N979 Female infertility, unspecified: Secondary | ICD-10-CM

## 2018-02-15 ENCOUNTER — Ambulatory Visit (HOSPITAL_COMMUNITY)
Admission: RE | Admit: 2018-02-15 | Discharge: 2018-02-15 | Disposition: A | Discharge status: Home / Self Care | Payer: 59 | Source: Ambulatory Visit | Attending: Obstetrics and Gynecology | Admitting: Obstetrics and Gynecology

## 2018-02-15 DIAGNOSIS — N979 Female infertility, unspecified: Secondary | ICD-10-CM | POA: Diagnosis not present

## 2018-02-15 DIAGNOSIS — N736 Female pelvic peritoneal adhesions (postinfective): Secondary | ICD-10-CM | POA: Diagnosis not present

## 2018-02-15 DIAGNOSIS — Z8759 Personal history of other complications of pregnancy, childbirth and the puerperium: Secondary | ICD-10-CM | POA: Diagnosis not present

## 2018-02-15 MED ORDER — IOPAMIDOL (ISOVUE-300) INJECTION 61%
30.0000 mL | Freq: Once | INTRAVENOUS | Status: AC | PRN
  Administered 2018-02-15: 3 mL

## 2018-03-04 DIAGNOSIS — Z319 Encounter for procreative management, unspecified: Secondary | ICD-10-CM | POA: Diagnosis not present

## 2018-03-12 DIAGNOSIS — Z3141 Encounter for fertility testing: Secondary | ICD-10-CM | POA: Diagnosis not present

## 2018-04-27 DIAGNOSIS — D62 Acute posthemorrhagic anemia: Secondary | ICD-10-CM | POA: Diagnosis not present

## 2018-04-27 DIAGNOSIS — Z8759 Personal history of other complications of pregnancy, childbirth and the puerperium: Secondary | ICD-10-CM | POA: Diagnosis not present

## 2018-05-10 DIAGNOSIS — N736 Female pelvic peritoneal adhesions (postinfective): Secondary | ICD-10-CM | POA: Diagnosis not present

## 2018-10-13 ENCOUNTER — Other Ambulatory Visit: Payer: Self-pay | Admitting: *Deleted

## 2018-10-13 DIAGNOSIS — Z20822 Contact with and (suspected) exposure to covid-19: Secondary | ICD-10-CM

## 2018-10-17 LAB — NOVEL CORONAVIRUS, NAA: SARS-CoV-2, NAA: NOT DETECTED

## 2019-03-10 ENCOUNTER — Other Ambulatory Visit: Payer: Self-pay | Admitting: Cardiology

## 2019-03-10 DIAGNOSIS — Z20822 Contact with and (suspected) exposure to covid-19: Secondary | ICD-10-CM

## 2019-03-14 LAB — NOVEL CORONAVIRUS, NAA: SARS-CoV-2, NAA: NOT DETECTED

## 2019-04-21 ENCOUNTER — Other Ambulatory Visit: Payer: Self-pay

## 2019-04-21 DIAGNOSIS — Z20822 Contact with and (suspected) exposure to covid-19: Secondary | ICD-10-CM

## 2019-04-23 LAB — NOVEL CORONAVIRUS, NAA: SARS-CoV-2, NAA: NOT DETECTED

## 2019-06-09 IMAGING — RF DG HYSTEROGRAM
5 series · 5 of 5 positions shown · IV contrast (omnipaque)
Comparison: None.

CLINICAL DATA: History of tubal pregnancy. Patient reports removal
of right tube.

EXAM:
HYSTEROSALPINGOGRAM
TECHNIQUE: Following cleansing of the cervix and vagina with Betadine solution,
a hysterosalpingogram was performed using a 5-French
hysterosalpingogram catheter and Omnipaque 300 contrast. The patient
tolerated the examination without difficulty.

[Series 1: run · 1 of 1 slices shown (1 of 5)]
[im 1/1]
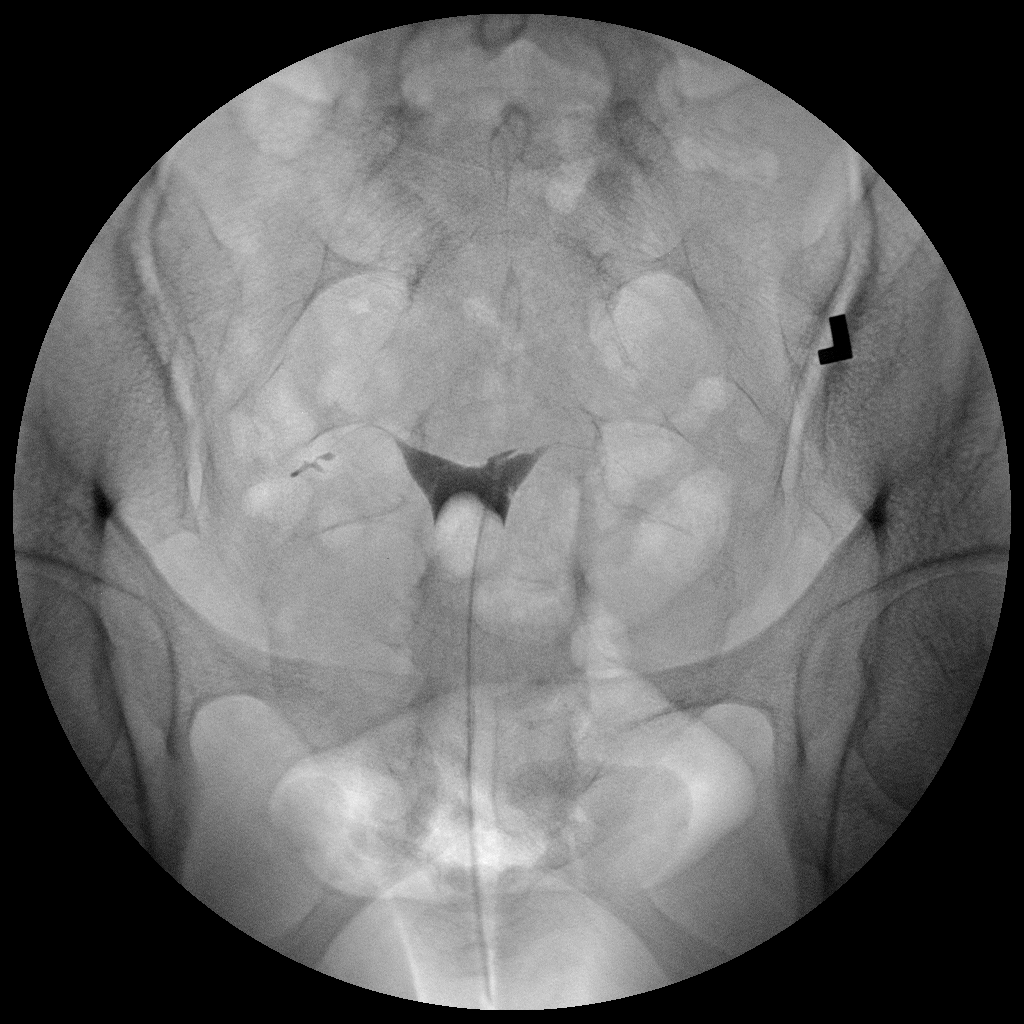

[Series 2: run · 1 of 1 slices shown (2 of 5)]
[im 1/1]
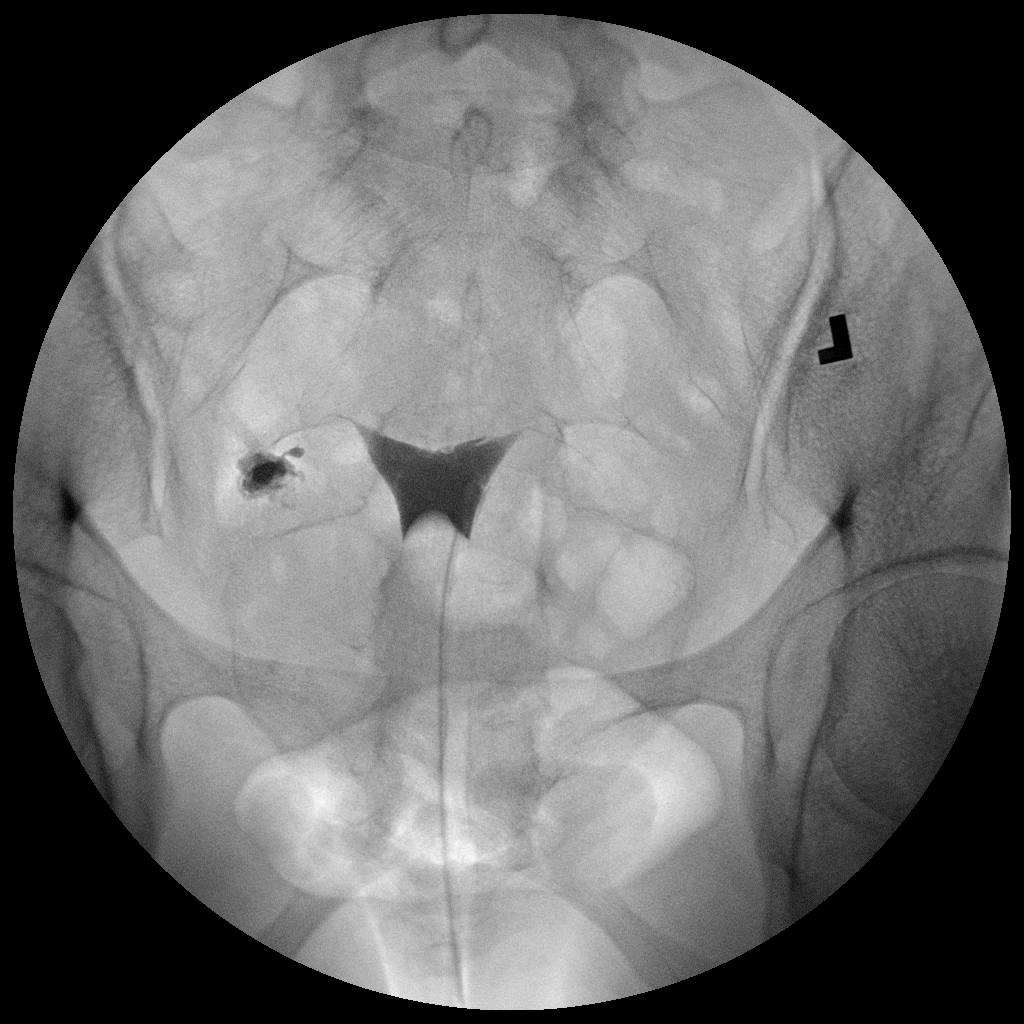

[Series 3: run · 1 of 1 slices shown (3 of 5)]
[im 1/1]
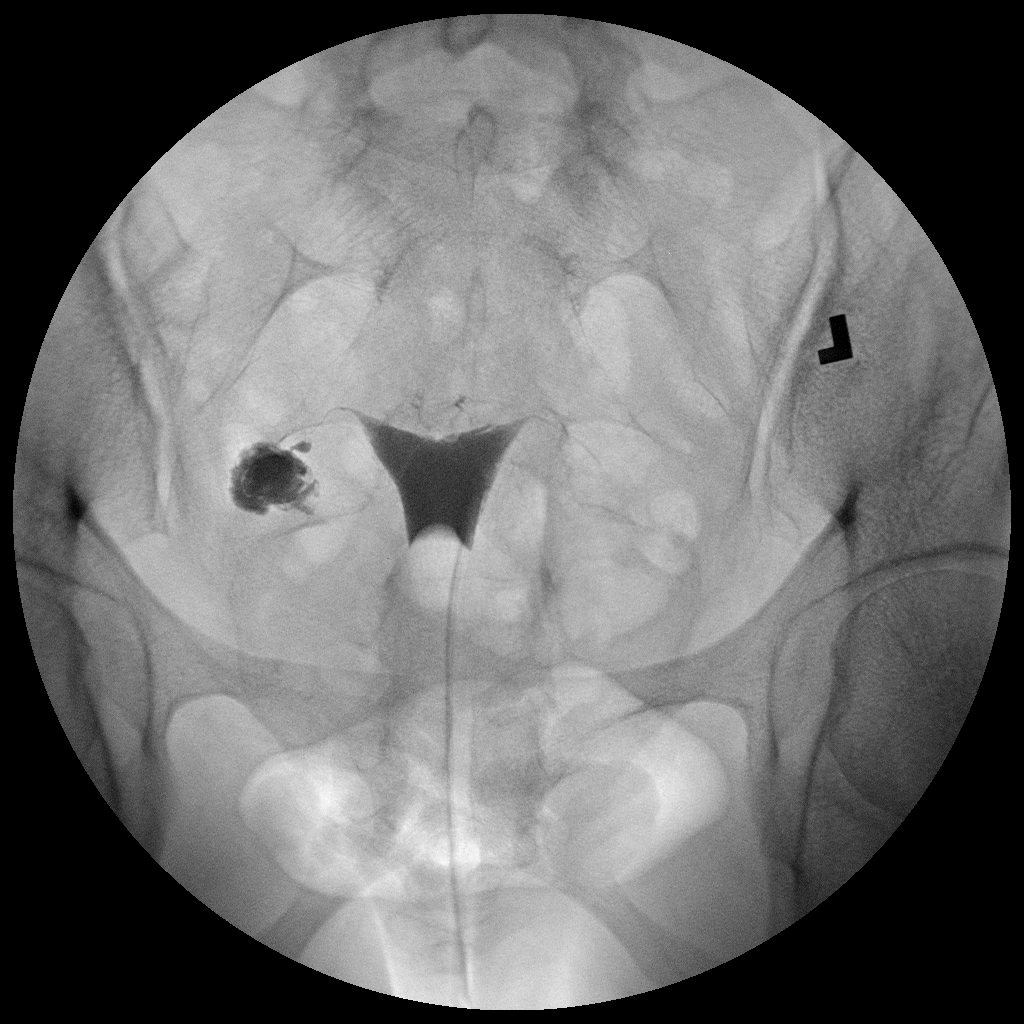

[Series 4: run · 1 of 1 slices shown (4 of 5)]
[im 1/1]
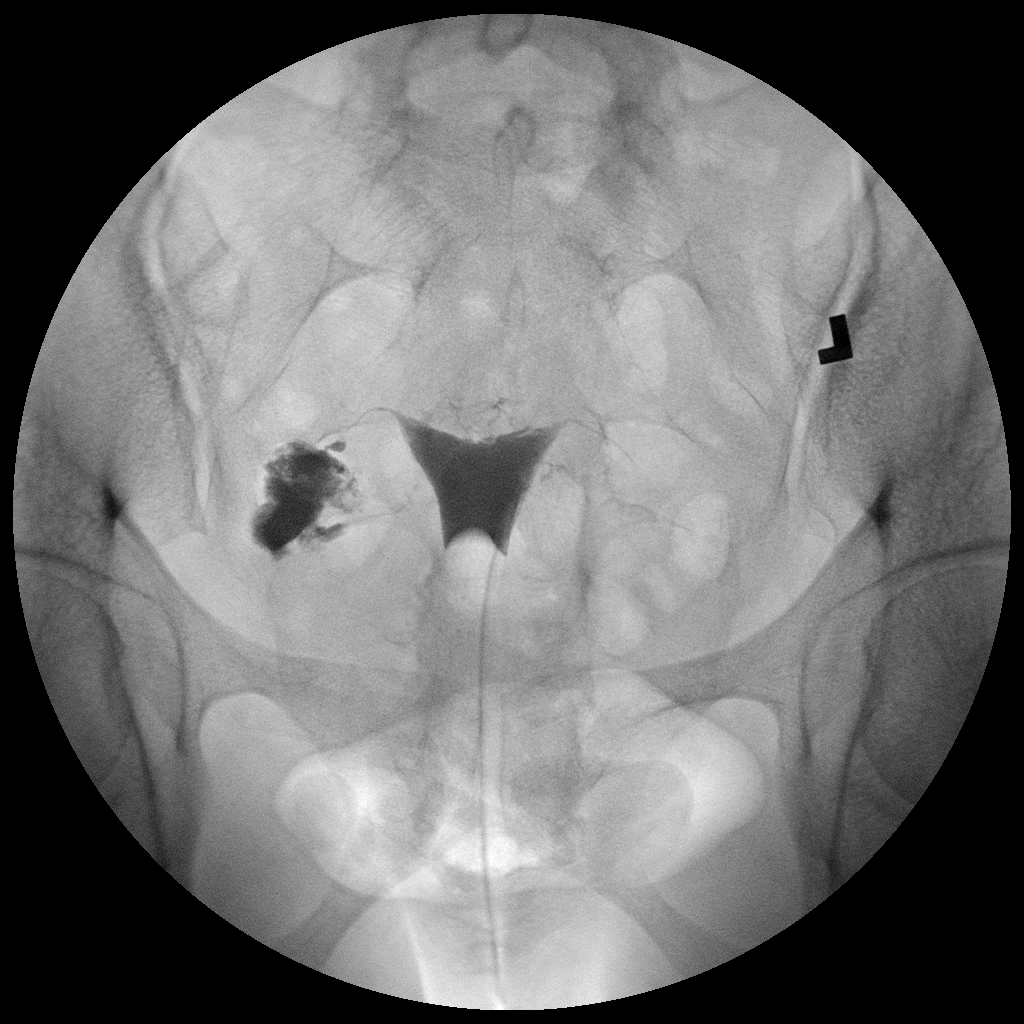

[Series 5: run · 1 of 1 slices shown (5 of 5)]
[im 1/1]
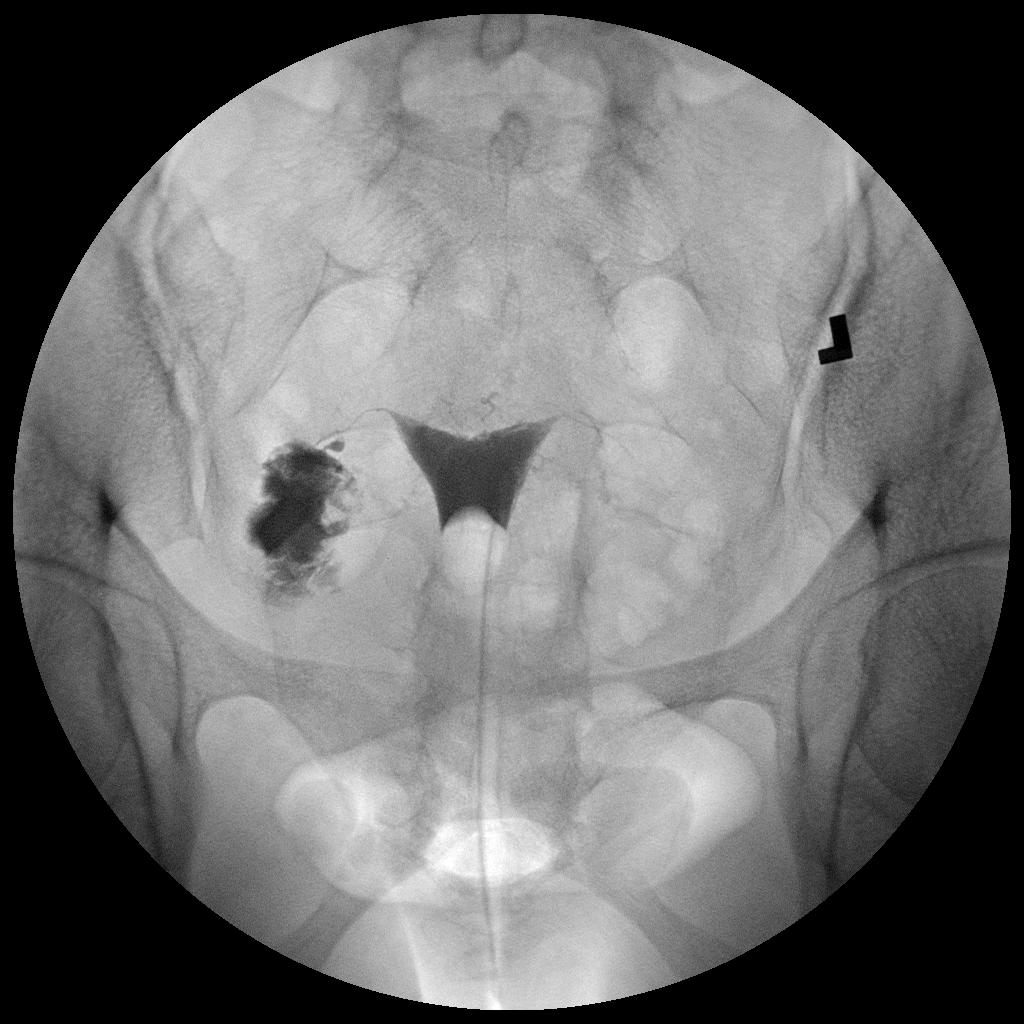

[5 of 5 positions shown; findings below may reference images not displayed]

FLUOROSCOPY TIME:  Radiation Exposure Index (as provided by the
fluoroscopic device):

If the device does not provide the exposure index:

Fluoroscopy Time:  1 minutes

Number of Acquired Images:  5
FINDINGS: Normal appearance of the endometrial canal. Visualization of only
the proximal left fallopian tube. Right fallopian tube is visualized
with spillage into a cavity in the right pelvis. No free flow of
contrast.
IMPRESSION: Nonvisualization of the left fallopian tube beyond the proximal
tube.

Spillage from the right fallopian tube into an irregular cavity,
possibly related to adhesions/scar tissue within the pelvis.

These findings the possibility of the left fallopian tube having
been removed for prior ectopic pregnancy. The patient reports the
right fallopian tube was removed. Recommend correlation with prior
surgical history and surgical report.

## 2020-05-11 ENCOUNTER — Other Ambulatory Visit: Payer: Self-pay

## 2020-05-11 DIAGNOSIS — Z20822 Contact with and (suspected) exposure to covid-19: Secondary | ICD-10-CM

## 2020-05-12 LAB — NOVEL CORONAVIRUS, NAA: SARS-CoV-2, NAA: NOT DETECTED

## 2020-05-12 LAB — SARS-COV-2, NAA 2 DAY TAT

## 2021-07-03 DIAGNOSIS — R7303 Prediabetes: Secondary | ICD-10-CM | POA: Diagnosis not present

## 2021-07-03 DIAGNOSIS — Z1331 Encounter for screening for depression: Secondary | ICD-10-CM | POA: Diagnosis not present

## 2021-07-03 DIAGNOSIS — Z975 Presence of (intrauterine) contraceptive device: Secondary | ICD-10-CM | POA: Diagnosis not present

## 2021-07-03 DIAGNOSIS — Z0000 Encounter for general adult medical examination without abnormal findings: Secondary | ICD-10-CM | POA: Diagnosis not present

## 2021-07-03 DIAGNOSIS — Z13220 Encounter for screening for lipoid disorders: Secondary | ICD-10-CM | POA: Diagnosis not present

## 2021-07-03 DIAGNOSIS — Z862 Personal history of diseases of the blood and blood-forming organs and certain disorders involving the immune mechanism: Secondary | ICD-10-CM | POA: Diagnosis not present

## 2021-09-24 ENCOUNTER — Encounter: Payer: Self-pay | Admitting: *Deleted
